# Patient Record
Sex: Female | Born: 1980 | Race: White | Hispanic: No | Marital: Married | Smoking: Never smoker
Health system: Southern US, Community
[De-identification: ages and names within clinical notes are randomized; demographics above are authoritative.]

## PROBLEM LIST (undated history)

## (undated) DIAGNOSIS — R002 Palpitations: Secondary | ICD-10-CM

## (undated) DIAGNOSIS — Z975 Presence of (intrauterine) contraceptive device: Secondary | ICD-10-CM

## (undated) DIAGNOSIS — F419 Anxiety disorder, unspecified: Secondary | ICD-10-CM

## (undated) DIAGNOSIS — K219 Gastro-esophageal reflux disease without esophagitis: Secondary | ICD-10-CM

## (undated) DIAGNOSIS — F410 Panic disorder [episodic paroxysmal anxiety] without agoraphobia: Secondary | ICD-10-CM

## (undated) DIAGNOSIS — J353 Hypertrophy of tonsils with hypertrophy of adenoids: Secondary | ICD-10-CM

## (undated) DIAGNOSIS — B977 Papillomavirus as the cause of diseases classified elsewhere: Secondary | ICD-10-CM

## (undated) DIAGNOSIS — T4145XA Adverse effect of unspecified anesthetic, initial encounter: Secondary | ICD-10-CM

## (undated) DIAGNOSIS — R1011 Right upper quadrant pain: Principal | ICD-10-CM

## (undated) DIAGNOSIS — T8859XA Other complications of anesthesia, initial encounter: Secondary | ICD-10-CM

## (undated) DIAGNOSIS — R87619 Unspecified abnormal cytological findings in specimens from cervix uteri: Secondary | ICD-10-CM

## (undated) DIAGNOSIS — IMO0002 Reserved for concepts with insufficient information to code with codable children: Secondary | ICD-10-CM

## (undated) DIAGNOSIS — J020 Streptococcal pharyngitis: Secondary | ICD-10-CM

## (undated) HISTORY — DX: Reserved for concepts with insufficient information to code with codable children: IMO0002

## (undated) HISTORY — DX: Presence of (intrauterine) contraceptive device: Z97.5

## (undated) HISTORY — DX: Panic disorder (episodic paroxysmal anxiety): F41.0

## (undated) HISTORY — PX: TYMPANOSTOMY TUBE PLACEMENT: SHX32

## (undated) HISTORY — DX: Anxiety disorder, unspecified: F41.9

## (undated) HISTORY — DX: Right upper quadrant pain: R10.11

## (undated) HISTORY — DX: Unspecified abnormal cytological findings in specimens from cervix uteri: R87.619

## (undated) HISTORY — DX: Papillomavirus as the cause of diseases classified elsewhere: B97.7

---
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
## 2002-02-16 ENCOUNTER — Inpatient Hospital Stay (HOSPITAL_COMMUNITY): Admission: AD | Admit: 2002-02-16 | Discharge: 2002-02-18 | Payer: Self-pay | Admitting: Obstetrics and Gynecology

## 2002-03-26 ENCOUNTER — Emergency Department (HOSPITAL_COMMUNITY): Admission: EM | Admit: 2002-03-26 | Discharge: 2002-03-26 | Payer: Self-pay | Admitting: Emergency Medicine

## 2002-03-30 ENCOUNTER — Emergency Department (HOSPITAL_COMMUNITY): Admission: EM | Admit: 2002-03-30 | Discharge: 2002-03-30 | Payer: Self-pay | Admitting: Emergency Medicine

## 2002-08-30 ENCOUNTER — Emergency Department (HOSPITAL_COMMUNITY): Admission: EM | Admit: 2002-08-30 | Discharge: 2002-08-30 | Payer: Self-pay | Admitting: *Deleted

## 2003-11-07 ENCOUNTER — Emergency Department (HOSPITAL_COMMUNITY): Admission: EM | Admit: 2003-11-07 | Discharge: 2003-11-07 | Payer: Self-pay | Admitting: Emergency Medicine

## 2005-02-27 ENCOUNTER — Ambulatory Visit (HOSPITAL_COMMUNITY): Admission: RE | Admit: 2005-02-27 | Discharge: 2005-02-27 | Payer: Self-pay | Admitting: Obstetrics and Gynecology

## 2005-04-09 ENCOUNTER — Ambulatory Visit (HOSPITAL_COMMUNITY): Admission: AD | Admit: 2005-04-09 | Discharge: 2005-04-09 | Payer: Self-pay | Admitting: Obstetrics and Gynecology

## 2005-05-14 ENCOUNTER — Inpatient Hospital Stay (HOSPITAL_COMMUNITY): Admission: AD | Admit: 2005-05-14 | Discharge: 2005-05-16 | Payer: Self-pay | Admitting: Obstetrics & Gynecology

## 2005-06-30 ENCOUNTER — Ambulatory Visit: Payer: Self-pay | Admitting: Cardiology

## 2005-06-30 ENCOUNTER — Ambulatory Visit (HOSPITAL_COMMUNITY): Admission: RE | Admit: 2005-06-30 | Discharge: 2005-06-30 | Payer: Self-pay | Admitting: Obstetrics and Gynecology

## 2005-08-22 ENCOUNTER — Emergency Department (HOSPITAL_COMMUNITY): Admission: EM | Admit: 2005-08-22 | Discharge: 2005-08-23 | Payer: Self-pay | Admitting: Emergency Medicine

## 2005-08-26 ENCOUNTER — Emergency Department (HOSPITAL_COMMUNITY): Admission: EM | Admit: 2005-08-26 | Discharge: 2005-08-27 | Payer: Self-pay | Admitting: Emergency Medicine

## 2005-09-03 ENCOUNTER — Ambulatory Visit (HOSPITAL_COMMUNITY): Admission: RE | Admit: 2005-09-03 | Discharge: 2005-09-03 | Payer: Self-pay | Admitting: Family Medicine

## 2006-10-12 ENCOUNTER — Emergency Department (HOSPITAL_COMMUNITY): Admission: EM | Admit: 2006-10-12 | Discharge: 2006-10-12 | Payer: Self-pay | Admitting: Emergency Medicine

## 2007-12-02 ENCOUNTER — Other Ambulatory Visit: Admission: RE | Admit: 2007-12-02 | Discharge: 2007-12-02 | Payer: Self-pay | Admitting: Obstetrics and Gynecology

## 2008-03-15 ENCOUNTER — Emergency Department (HOSPITAL_COMMUNITY): Admission: EM | Admit: 2008-03-15 | Discharge: 2008-03-15 | Payer: Self-pay | Admitting: Emergency Medicine

## 2008-03-28 ENCOUNTER — Ambulatory Visit (HOSPITAL_COMMUNITY): Admission: RE | Admit: 2008-03-28 | Discharge: 2008-03-28 | Payer: Self-pay | Admitting: Family Medicine

## 2008-07-09 ENCOUNTER — Other Ambulatory Visit: Admission: RE | Admit: 2008-07-09 | Discharge: 2008-07-09 | Payer: Self-pay | Admitting: Obstetrics and Gynecology

## 2009-07-22 ENCOUNTER — Other Ambulatory Visit: Admission: RE | Admit: 2009-07-22 | Discharge: 2009-07-22 | Payer: Self-pay | Admitting: Obstetrics and Gynecology

## 2009-10-08 ENCOUNTER — Emergency Department (HOSPITAL_COMMUNITY): Admission: EM | Admit: 2009-10-08 | Discharge: 2009-10-08 | Payer: Self-pay | Admitting: Emergency Medicine

## 2010-04-08 ENCOUNTER — Other Ambulatory Visit: Admission: RE | Admit: 2010-04-08 | Discharge: 2010-04-08 | Payer: Self-pay | Admitting: Obstetrics and Gynecology

## 2010-05-12 ENCOUNTER — Ambulatory Visit: Payer: Self-pay | Admitting: Family

## 2010-05-12 ENCOUNTER — Inpatient Hospital Stay (HOSPITAL_COMMUNITY): Admission: AD | Admit: 2010-05-12 | Discharge: 2010-05-13 | Payer: Self-pay | Admitting: Family Medicine

## 2010-11-06 ENCOUNTER — Inpatient Hospital Stay (HOSPITAL_COMMUNITY)
Admission: AD | Admit: 2010-11-06 | Discharge: 2010-11-08 | DRG: 775 | Disposition: A | Discharge status: Home / Self Care | Payer: 59 | Source: Ambulatory Visit | Attending: Obstetrics and Gynecology | Admitting: Obstetrics and Gynecology

## 2010-11-06 DIAGNOSIS — O99892 Other specified diseases and conditions complicating childbirth: Secondary | ICD-10-CM

## 2010-11-06 DIAGNOSIS — O3660X0 Maternal care for excessive fetal growth, unspecified trimester, not applicable or unspecified: Secondary | ICD-10-CM

## 2010-11-06 DIAGNOSIS — Z22330 Carrier of Group B streptococcus: Secondary | ICD-10-CM

## 2010-11-06 DIAGNOSIS — O9989 Other specified diseases and conditions complicating pregnancy, childbirth and the puerperium: Secondary | ICD-10-CM

## 2010-11-06 LAB — CBC
HCT: 34.9 % — ABNORMAL LOW (ref 36.0–46.0)
Hemoglobin: 10.9 g/dL — ABNORMAL LOW (ref 12.0–15.0)
MCH: 25.7 pg — ABNORMAL LOW (ref 26.0–34.0)
MCHC: 31.2 g/dL (ref 30.0–36.0)
MCV: 82.3 fL (ref 78.0–100.0)
RBC: 4.24 MIL/uL (ref 3.87–5.11)
RDW: 14.5 % (ref 11.5–15.5)
WBC: 6.8 10*3/uL (ref 4.0–10.5)

## 2010-11-13 NOTE — H&P (Signed)
  NAMEARNECIA, Heather [name]           ACCOUNT NO.:  [id number]  MEDICAL RECORD NO.:  [id number]         PATIENT TYPE:  WINP  LOCATION:  166                           FACILITY:  [facility]  PHYSICIAN:  Tilda Burrow, M.D. DATE OF BIRTH:  12/18/1980  DATE OF ADMISSION:  11/05/2010 DATE OF DISCHARGE:                             HISTORY & PHYSICAL   ADMISSION DIAGNOSIS:  Pregnancy 13 and 9 weeks 2 days, elective induction of labor.  HISTORY OF PRESENT ILLNESS:  This is a 30 year old female gravida 3, para 2-0-0-2 with ultrasound assigned EDC of November 10, 2009 based on a 6-week ultrasound and menstrual assigned [facility] of November 06, 2010 placing her at 39 weeks 2 by earliest ultrasound and 39 weeks 5 days by LMP.  She is scheduled for elective induction of labor.  Prenatal course has been notable for this baby being the largest baby she has had.  She is a gravida 3, para 2.  Her prior babies weighed 7.5 and 7.14.  This infant was estimated by ultrasound on October 23, 2010 to be 3818 grams almost a pound larger.  Cervix is quite favorable at 4 cm, 50%, -1, vertex presentation.  Risks of induction including fetal distress, difficulty with delivery or other usual complications associated with labor have been reviewed.  The patient requests induction of labor due to concerns over fetal/infant size and pressure and discomfort.  PRENATAL LABORATORY DATA:  Blood type is A+.  Antibody screen negative. Rubella immune present.  Hemoglobin 13 and hematocrit 39.  Hepatitis, HIV, RPR, GC, and Chlamydia all negative.  MSAFP normal.  Hemoglobin 11, hematocrit 34 at 28 weeks.  Glucose tolerance test normal on 2-hour testing at 28 weeks.  It is a female, she plans to breastfeed and bottle supplement.  Future contraception is undetermined.  PHYSICAL EXAMINATION:  GENERAL:  Healthy Caucasian female. VITAL SIGNS:  Weight 188.4 which is a 24-pound weight gain, fundal height is 42 cm, estimated fetal weight is  8.5-9 pounds. GU:  Cervix is 4 cm, 50%, -1, posteriorly oriented cervix, vertex presentation well applied to cervix.  PLAN:  Admit this evening for induction of labor.  Good prognosis for vaginal delivery.     Tilda Burrow, M.D.     JVF/MEDQ  D:  11/05/2010  T:  11/05/2010  Job:  [id number]  cc:   [facility] Obstetrics and Gynecology  Electronically Signed by Christin Bach M.D. on 11/13/2010 06:36:09 PM

## 2010-12-05 LAB — COMPREHENSIVE METABOLIC PANEL
ALT: 43 U/L — ABNORMAL HIGH (ref 0–35)
AST: 28 U/L (ref 0–37)
Albumin: 3.2 g/dL — ABNORMAL LOW (ref 3.5–5.2)
Alkaline Phosphatase: 49 U/L (ref 39–117)
BUN: 6 mg/dL (ref 6–23)
CO2: 26 mEq/L (ref 19–32)
Calcium: 9 mg/dL (ref 8.4–10.5)
Chloride: 108 mEq/L (ref 96–112)
Creatinine, Ser: 0.57 mg/dL (ref 0.4–1.2)
GFR calc Af Amer: >60 mL/min (ref >60)
GFR calc non Af Amer: >60 mL/min (ref >60)
Glucose, Bld: 87 mg/dL (ref 70–99)
Potassium: 3.4 mEq/L — ABNORMAL LOW (ref 3.5–5.1)
Sodium: 139 mEq/L (ref 135–145)
Total Bilirubin: 0.3 mg/dL (ref 0.3–1.2)
Total Protein: 5.8 g/dL — ABNORMAL LOW (ref 6.0–8.3)

## 2010-12-05 LAB — CBC
HCT: 32.2 % — ABNORMAL LOW (ref 36.0–46.0)
Hemoglobin: 11.3 g/dL — ABNORMAL LOW (ref 12.0–15.0)
MCH: 31.7 pg (ref 26.0–34.0)
MCHC: 35.1 g/dL (ref 30.0–36.0)
MCV: 90.4 fL (ref 78.0–100.0)
Platelets: 149 10*3/uL — ABNORMAL LOW (ref 150–400)
RBC: 3.56 MIL/uL — ABNORMAL LOW (ref 3.87–5.11)
RDW: 12.8 % (ref 11.5–15.5)
WBC: 7.4 10*3/uL (ref 4.0–10.5)

## 2011-02-06 NOTE — Op Note (Signed)
NAME:  Heather [name], Heather [name]           ACCOUNT NO.:  [id number]   MEDICAL RECORD NO.:  [id number]          PATIENT TYPE:  INP   LOCATION:  A413                          FACILITY:  [facility]   PHYSICIAN:  Lazaro Arms, M.D.   DATE OF BIRTH:  [birth year]   DATE OF PROCEDURE:  DATE OF DISCHARGE:  05/16/2005                                 OPERATIVE REPORT   PROCEDURE:  Epidural placement.   INDICATION:  [name] is a 30 year old female who was induced at 39 weeks  because of general discomfort and advanced cervical dilatation.  She was  placed on Pitocin induction on the morning of 05/14/2005 and wanted an  epidural to be placed.   DESCRIPTION OF PROCEDURE:  She was placed in the sitting position and  Betadine prep was used.  The L3-L4 space was identified and infiltrate was  placed.  The area was anesthetized with 1% lidocaine.  A 17-gauge Tuohy  needle was used and loss of resistance technique employed.  The epidural  space was found without difficulty.  Ten mL 0.125% bupivacaine plain was  given into the epidural space without adverse effects.  Epidural catheter  was then fed into the epidural space and the needle was removed.  An  additional 10 mL of 0.125% bupivacaine was given to bolus up the epidural.  It was then taped down 5 cm into the epidural space of the skin.  A  continuous infusion was then started with 0.125% bupivacaine with 2 mcg/mL  fentanyl at 12 mL/hr.  The patient tolerated the procedure well, was getting  relief, and the blood pressure and fetal heart rate tracing were reassuring.      Lazaro Arms, M.D.  Electronically Signed     LHE/MEDQ  D:  06/24/2005  T:  06/24/2005  Job:  [id number]

## 2011-02-06 NOTE — Consult Note (Signed)
NAME:  Heather, [name] NO.:  [id number]   MEDICAL RECORD NO.:  [id number]          PATIENT TYPE:  OIB   LOCATION:  A415                          FACILITY:  [facility]   PHYSICIAN:  Tilda Burrow, M.D. DATE OF BIRTH:  09-Oct-1980   DATE OF CONSULTATION:  DATE OF DISCHARGE:                                   CONSULTATION   HISTORY OF PRESENT ILLNESS:  Heather [name] came in on February 27, 2005 at nine  minutes after midnight with complaints of some dizziness earlier in the  evening.  I think it just scared her.  She was not dizzy when she got to the  hospital.  Her blood sugar was fine, blood pressure was fine, the baby  looked fine, everything was fine.  She was discharged home one hour later.       FC/MEDQ  D:  02/27/2005  T:  02/27/2005  Job:  [id number]   cc:   [facility]

## 2011-02-06 NOTE — Op Note (Signed)
NAME:  Heather [name], Heather [name] NO.:  [id number]   MEDICAL RECORD NO.:  [id number]          PATIENT TYPE:  INP   LOCATION:  LDR2                          FACILITY:  [facility]   PHYSICIAN:  Tilda Burrow, M.D. DATE OF BIRTH:  [birth year]   DATE OF PROCEDURE:  05/14/2005  DATE OF DISCHARGE:                                 OPERATIVE REPORT   DELIVERY NOTE:  [name] was fully dilated with complaints of some pressure  at approximately 1330. She pushed for about 40 minutes and had a spontaneous  vaginal delivery of a viable female infant at 30. The mouth and nose were  suctioned on the perineum, the body delivered without difficulty. Apgars  were 9 and 9. Weight 7 pounds 5 ounces. The placenta separated partially a  few minutes later, and she had some bleeding. Twenty units of pitocin  diluted in 1,000 cc of lactated ringers was started and using wide open IV.  I ended up having to go in and do a partial manual removal of the placenta.  It came out intact at about 1420. There was a three-vessel cord. After the  delivery of the placenta, the lochia was fine. Fundus was firm at about 2  below the umbilicus. The epidural catheter was then removed with the blue  tip visualized as being intact. Estimated blood loss about 400 to 500 cc.  The vagina was inspected and was intact.      [name], C.N.M.      Tilda Burrow, M.D.  Electronically Signed    FC/MEDQ  D:  05/14/2005  T:  05/14/2005  Job:  [id number]

## 2011-02-06 NOTE — Procedures (Signed)
NAME:  Heather [name], Heather [name]           ACCOUNT NO.:  [id number]   MEDICAL RECORD NO.:  [id number]          PATIENT TYPE:  OUT   LOCATION:  RAD                           FACILITY:  [facility]   PHYSICIAN:  [place] Bing, M.D. [facility] OF BIRTH:  [birth year]   DATE OF PROCEDURE:  07/01/2005  DATE OF DISCHARGE:                                  ECHOCARDIOGRAM   REFERRING:  Dr. Phillips Odor and Dr. Emelda Fear   CLINICAL DATA:  A 30 year old woman who is five weeks postpartum and is now  experiencing palpitations and chest pain.   Aorta 2.1, left atrium 2.7, septum 0.9, posterior wall 0.9, LV diastole 4.0,  LV systole 2.8.   1.  Technically adequate echocardiographic study.  2.  Normal left atrium, right atrium and right ventricle.  3.  Normal aortic, tricuspid, pulmonic and mitral valves.  4.  Normal Doppler study with physiologic tricuspid regurgitation; normal      estimated RV systolic pressure.  5.  Normal proximal pulmonary artery.  6.  Normal IVC.  7.  Normal internal dimension, wall thickness, regional and global function      of the left ventricle.      [place]vel Bing, M.D. [facility]  Electronically Signed     RR/MEDQ  D:  07/01/2005  T:  07/01/2005  Job:  [id number]

## 2011-02-06 NOTE — H&P (Signed)
NAME:  Heather [name], HALBERG NO.:  [id number]   MEDICAL RECORD NO.:  [id number]          PATIENT TYPE:  INP   LOCATION:  LDR2                          FACILITY:  [facility]   PHYSICIAN:  Lazaro Arms, M.D.   DATE OF BIRTH:  09/10/1981   DATE OF ADMISSION:  DATE OF DISCHARGE:  LH                                HISTORY & PHYSICAL   CHIEF COMPLAINT:  Labor induction secondary to advanced cervical dilatation  and maternal discomfort.   HISTORY OF PRESENT ILLNESS:  Heather [name] is a 30 year old gravida 2, para 1  with an EDC of May 22, 2005 based on a first and second trimester  ultrasound, placing her at [redacted] weeks gestation. She started prenatal care in  her first trimester and has had regular visits. Her prenatal course has  basically been uneventful. Blood pressures have been 100 to 120s over 60s to  80s. Total weight gain has been 24 pounds with appropriate fundal height  growth.   PRENATAL LABORATORY DATA:  Blood type A positive, rubella immune. HBSAG,  HIV, RPR, HSV, gonorrhea, chlamydia are all negative. GBS is positive. One-  hour GTT was normal at 102. MS AFP was also normal. A 28-week H and H was 12  and 36.   PAST MEDICAL HISTORY:  History of hypothyroidism. Her TSH at last check was  within normal limits.   PAST SURGICAL HISTORY:  Noncontributory.   ALLERGIES:  No known drug allergies.   HABITS:  Denies cigarette smoking, alcohol, or drug use.   SOCIAL HISTORY:  Married. Works in the [employer].   FAMILY HISTORY:  Noncontributory.   OBSTETRICAL HISTORY:  Had a five-hour induced labor of a 7-pound 15-ounce  female back in 2003. Pretty much the same scenario with 4 cm dilated and then  received AROM and Pitocin.   PHYSICAL EXAMINATION:  HEENT:  Within normal limits.  HEART:  Regular rate and rhythm.  LUNGS:  Clear.  ABDOMEN:  Soft and nontender. Fundal height is 37 cm.  PELVIC:  Cervix was 3 to 4 cm, 75% effaced, 0 station at vertex  presentation.  EXTREMITIES:  Legs trace edema.   IMPRESSION:  Intrauterine pregnancy at 39 weeks, elective induction of  labor. 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   PLAN:  Will admit to the labor and delivery unit early Thursday morning.  Begin GBS prophylaxis, Pitocin, and AROM. The patient plans an epidural.      [name], C.N.M.      Lazaro Arms, M.D.  Electronically Signed    FC/MEDQ  D:  05/12/2005  T:  05/12/2005  Job:  [id number]

## 2013-01-24 ENCOUNTER — Other Ambulatory Visit: Payer: Self-pay | Admitting: Adult Health

## 2013-01-24 ENCOUNTER — Encounter: Payer: Self-pay | Admitting: *Deleted

## 2013-01-25 ENCOUNTER — Encounter: Payer: Self-pay | Admitting: Adult Health

## 2013-01-25 ENCOUNTER — Ambulatory Visit (INDEPENDENT_AMBULATORY_CARE_PROVIDER_SITE_OTHER): Payer: 59 | Admitting: Adult Health

## 2013-01-25 ENCOUNTER — Other Ambulatory Visit (HOSPITAL_COMMUNITY)
Admission: RE | Admit: 2013-01-25 | Discharge: 2013-01-25 | Disposition: A | Discharge status: Home / Self Care | Payer: 59 | Source: Ambulatory Visit | Attending: Adult Health | Admitting: Adult Health

## 2013-01-25 VITALS — BP 108/60 | HR 88 | Ht 64.5 in | Wt 174.0 lb

## 2013-01-25 DIAGNOSIS — Z87898 Personal history of other specified conditions: Secondary | ICD-10-CM | POA: Insufficient documentation

## 2013-01-25 DIAGNOSIS — Z01419 Encounter for gynecological examination (general) (routine) without abnormal findings: Secondary | ICD-10-CM | POA: Insufficient documentation

## 2013-01-25 DIAGNOSIS — Z113 Encounter for screening for infections with a predominantly sexual mode of transmission: Secondary | ICD-10-CM | POA: Insufficient documentation

## 2013-01-25 DIAGNOSIS — Z975 Presence of (intrauterine) contraceptive device: Secondary | ICD-10-CM | POA: Insufficient documentation

## 2013-01-25 DIAGNOSIS — F419 Anxiety disorder, unspecified: Secondary | ICD-10-CM | POA: Insufficient documentation

## 2013-01-25 MED ORDER — IBUPROFEN 100 MG PO TABS: 200.0000 mg | ORAL_TABLET | Freq: Four times a day (QID) | ORAL | Status: DC | PRN

## 2013-01-25 MED ORDER — CITALOPRAM HYDROBROMIDE 20 MG PO TABS: 20.0000 mg | ORAL_TABLET | Freq: Every day | ORAL | Status: DC

## 2013-01-25 NOTE — Patient Instructions (Addendum)
Follow up in 1 year physical Sign up for my chart

## 2013-01-25 NOTE — Progress Notes (Signed)
Patient ID: Heather [name], female   DOB: 1981-08-23, 32 y.o.   MRN: [id number] History of Present Illness: Heather [name] is a 32 year old white female in for pap and physical. She works 12 hours at [employer].  Current Medications, Allergies, Past Medical History, Past Surgical History, Family History and Social History were reviewed in [employer] record.    Review of Systems: Patient denies any headaches, blurred vision, shortness of breath, chest pain, abdominal pain, problems with bowel movements, urination, or intercourse. No joint swelling, she is good on celexa for her anxiety   Physical Exam:Blood pressure 108/60, pulse 88, height 5' 4.5" (1.638 m), weight 174 lb (78.926 kg). General:  Well developed, well nourished, no acute distress Skin:  Warm and dry Neck:  Midline trachea, normal thyroid Lungs; Clear to auscultation bilaterally Breast:  No dominant palpable mass, retraction, or nipple discharge Cardiovascular: Regular rate and rhythm Abdomen:  Soft, non tender, no hepatosplenomegaly, she has a navel ring Pelvic:  External genitalia is normal in appearance.  The vagina is normal in appearance. The cervix is bulbous.IUD strings not visible,pap performed with GC/CHL, and IUD placement confirmed by [place].  Uterus is felt to be normal size, shape, and contour.  No adnexal masses or tenderness noted. Extremities:  No swelling or varicosities noted Psych:  No mood changes, alert and cooperative   Impression: Yearly exam Anxiety History of abnormal pap IUD in place  Plan: Physical in 1 year

## 2013-01-31 ENCOUNTER — Telehealth: Payer: Self-pay | Admitting: Adult Health

## 2013-01-31 NOTE — Telephone Encounter (Signed)
Left message that pap was good

## 2014-01-22 ENCOUNTER — Ambulatory Visit (INDEPENDENT_AMBULATORY_CARE_PROVIDER_SITE_OTHER): Payer: 59 | Admitting: Orthopedic Surgery

## 2014-01-22 ENCOUNTER — Ambulatory Visit (INDEPENDENT_AMBULATORY_CARE_PROVIDER_SITE_OTHER): Payer: 59

## 2014-01-22 ENCOUNTER — Encounter: Payer: Self-pay | Admitting: Orthopedic Surgery

## 2014-01-22 VITALS — BP 127/86 | Ht 66.0 in | Wt 172.0 lb

## 2014-01-22 DIAGNOSIS — M25559 Pain in unspecified hip: Secondary | ICD-10-CM

## 2014-01-22 DIAGNOSIS — M25552 Pain in left hip: Secondary | ICD-10-CM

## 2014-01-22 DIAGNOSIS — S76019A Strain of muscle, fascia and tendon of unspecified hip, initial encounter: Secondary | ICD-10-CM | POA: Insufficient documentation

## 2014-01-22 DIAGNOSIS — IMO0002 Reserved for concepts with insufficient information to code with codable children: Secondary | ICD-10-CM

## 2014-01-22 MED ORDER — NABUMETONE 500 MG PO TABS: 500.0000 mg | ORAL_TABLET | Freq: Every day | ORAL | Status: DC

## 2014-01-22 NOTE — Patient Instructions (Signed)
Take medication as prescribed.

## 2014-01-22 NOTE — Progress Notes (Signed)
Patient ID: Heather [name], female   DOB: Oct 16, 1980, 33 y.o.   MRN: [id number]  Chief Complaint  Patient presents with  . Hip Pain    Left hip pain, no injury    This is a 33 year old female presents with a history of atraumatic onset of left hip pain x2 months which may or may not be related to her exercise program. She's not really had any treatment at this time pain occurs when walking for exercise or walking at work. The pain is over the left iliac crest and is associated with catching a dull burning sensation and some aching. Pain is 4/10 it hurts constantly she did take some Tylenol without relief  Her past family social history and review of systems are recorded in the medical record for the following update on her review of systems. 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 Otherwise normal. Current medications Celexa 10 mg daily  Her mother and father are both alive. She has no allergies to medications but did have a reaction to lidocaine and does not like pain medications such as narcotics  BP 127/86  Ht 5\' 6"  (1.676 m)  Wt 172 lb (78.019 kg)  BMI 27.77 kg/m2 Vital signs:   General the patient is well-developed and well-nourished grooming and hygiene are normal Oriented x3 Mood and affect normal Ambulation normal  Inspection of the right hip normal range of motion and stability test motor exam normal skin clean dry intact normal distal neurovascular function.  Left hip tenderness over the iliac crest and abductor musculature without tenderness over the greater trochanter. She has full range of motion of the hip with pain with internal rotation but not in the groin and over the iliac crest she has pain to manual muscle testing against hip abduction also in the same area. Motor exam is normal skin clean dry and intact cardiovascular and normal sensory exam normal   Left hip AP lateral x-ray are normal  Encounter Diagnoses   Name Primary?  . Left hip pain Yes  . Strain of hip     Recommend Relafen 500 mg twice a day followup in one month

## 2014-02-02 ENCOUNTER — Other Ambulatory Visit: Payer: 59 | Admitting: Adult Health

## 2014-02-22 ENCOUNTER — Other Ambulatory Visit: Payer: 59 | Admitting: Adult Health

## 2014-02-27 ENCOUNTER — Ambulatory Visit: Payer: 59 | Admitting: Orthopedic Surgery

## 2014-03-05 ENCOUNTER — Other Ambulatory Visit: Payer: 59 | Admitting: Adult Health

## 2014-03-15 ENCOUNTER — Other Ambulatory Visit: Payer: Self-pay | Admitting: Adult Health

## 2014-03-15 ENCOUNTER — Telehealth: Payer: Self-pay | Admitting: Adult Health

## 2014-03-15 NOTE — Telephone Encounter (Signed)
Spoke with pt. Pt states Celexa has already been ordered. Pt was wondering if any refills was on that Rx. I advised 11 refills were ordered. Pt voiced understanding. JSY

## 2014-03-22 ENCOUNTER — Ambulatory Visit: Payer: 59 | Admitting: Orthopedic Surgery

## 2014-03-22 ENCOUNTER — Encounter: Payer: Self-pay | Admitting: Orthopedic Surgery

## 2014-04-05 ENCOUNTER — Other Ambulatory Visit: Payer: 59 | Admitting: Adult Health

## 2014-05-03 ENCOUNTER — Ambulatory Visit (INDEPENDENT_AMBULATORY_CARE_PROVIDER_SITE_OTHER): Payer: 59 | Admitting: Adult Health

## 2014-05-03 ENCOUNTER — Other Ambulatory Visit (HOSPITAL_COMMUNITY)
Admission: RE | Admit: 2014-05-03 | Discharge: 2014-05-03 | Disposition: A | Discharge status: Home / Self Care | Payer: 59 | Source: Ambulatory Visit | Attending: Adult Health | Admitting: Adult Health

## 2014-05-03 ENCOUNTER — Encounter: Payer: Self-pay | Admitting: Adult Health

## 2014-05-03 VITALS — BP 100/70 | HR 76 | Ht 64.5 in | Wt 173.5 lb

## 2014-05-03 DIAGNOSIS — Z124 Encounter for screening for malignant neoplasm of cervix: Secondary | ICD-10-CM | POA: Diagnosis not present

## 2014-05-03 DIAGNOSIS — F419 Anxiety disorder, unspecified: Secondary | ICD-10-CM

## 2014-05-03 DIAGNOSIS — Z8742 Personal history of other diseases of the female genital tract: Secondary | ICD-10-CM

## 2014-05-03 DIAGNOSIS — Z1151 Encounter for screening for human papillomavirus (HPV): Secondary | ICD-10-CM | POA: Diagnosis present

## 2014-05-03 DIAGNOSIS — R87810 Cervical high risk human papillomavirus (HPV) DNA test positive: Secondary | ICD-10-CM | POA: Diagnosis not present

## 2014-05-03 DIAGNOSIS — Z01419 Encounter for gynecological examination (general) (routine) without abnormal findings: Secondary | ICD-10-CM

## 2014-05-03 DIAGNOSIS — Z975 Presence of (intrauterine) contraceptive device: Secondary | ICD-10-CM

## 2014-05-03 DIAGNOSIS — Z113 Encounter for screening for infections with a predominantly sexual mode of transmission: Secondary | ICD-10-CM | POA: Insufficient documentation

## 2014-05-03 NOTE — Patient Instructions (Signed)
Physical in 1 year Check breasts regular

## 2014-05-03 NOTE — Progress Notes (Signed)
Patient ID: Heather [name], female   DOB: 1980-12-15, 33 y.o.   MRN: [id number] History of Present Illness: Heather [name] is a 33 year old white female, single in for a pap and physical, complains of pain in breast.Has IUD and is happy not having periods, ha more discharge since IUD.   Current Medications, Allergies, Past Medical History, Past Surgical History, Family History and Social History were reviewed in [facility] electronic medical record.     Review of Systems: Patient denies any headaches, blurred vision, shortness of breath, chest pain, abdominal pain, problems with bowel movements, urination, or intercourse. No joint pain,moods good on celexa.See HPI for positives.    Physical Exam:BP 100/70  Pulse 76  Ht 5' 4.5" (1.638 m)  Wt 173 lb 8 oz (78.699 kg)  BMI 29.33 kg/m2 General:  Well developed, well nourished, no acute distress Skin:  Warm and dry Neck:  Midline trachea, normal thyroid Lungs; Clear to auscultation bilaterally Breast:  No dominant palpable mass, retraction, or nipple discharge, has bilateral regular irregularities  Cardiovascular: Regular rate and rhythm Abdomen:  Soft, non tender, no hepatosplenomegaly Pelvic:  External genitalia is normal in appearance.  The vagina is normal in appearance.  The cervix is bulbous, +IUD strings, pap with HPV,GC/CHL performed.  Uterus is felt to be normal size, shape, and contour.  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 Let her do breast exam on handheld model and discussed findings. Declines blood work.   Impression: Yearly gyn exam  IUD in place Anxiety History of abnormal pap    Plan: Physical in 1 year Check breasts, call with any changes

## 2014-05-04 LAB — CYTOLOGY - PAP

## 2014-05-08 ENCOUNTER — Telehealth: Payer: Self-pay | Admitting: Adult Health

## 2014-05-08 NOTE — Telephone Encounter (Signed)
Pt aware of pap showing abnormal cells and +HPV will make appt for colpo

## 2014-05-08 NOTE — Telephone Encounter (Signed)
Has colpo appt Monday had question about bleeding after pap but it stopped.keep appt

## 2014-05-14 ENCOUNTER — Encounter: Payer: Self-pay | Admitting: Obstetrics & Gynecology

## 2014-05-14 ENCOUNTER — Ambulatory Visit (INDEPENDENT_AMBULATORY_CARE_PROVIDER_SITE_OTHER): Payer: 59 | Admitting: Obstetrics & Gynecology

## 2014-05-14 ENCOUNTER — Other Ambulatory Visit: Payer: Self-pay | Admitting: Obstetrics & Gynecology

## 2014-05-14 VITALS — BP 120/72 | Ht 65.0 in | Wt 174.0 lb

## 2014-05-14 DIAGNOSIS — D069 Carcinoma in situ of cervix, unspecified: Secondary | ICD-10-CM | POA: Insufficient documentation

## 2014-05-14 DIAGNOSIS — Z3202 Encounter for pregnancy test, result negative: Secondary | ICD-10-CM

## 2014-05-14 DIAGNOSIS — Z3200 Encounter for pregnancy test, result unknown: Secondary | ICD-10-CM

## 2014-05-14 LAB — POCT URINE PREGNANCY: Preg Test, Ur: NEGATIVE

## 2014-05-14 NOTE — Progress Notes (Signed)
Patient ID: Heather [name], female   DOB: 1981-09-20, 33 y.o.   MRN: [id number] Colposcopy Procedure Note:  Pap performed:  8/13 Result:  HSIL Smoker:  No. New sexual partner:  No.  : time frame:    History of abnormal Pap: yes 8-9 years ago  Due to the indications listed above, a thorough colposcopic evaluation of the cervix and upper vagina was performed in the usual fashion using 3% acetic acid.  The findings of the visual inspection are noted below:  adequate Acetowhite changes       positive Punctation                      positive Mosaicism                      negative Abnormal Vessels          negative  Biopsy performed:          Yes.    Complications: no   Impression: LSIL  Recommendation: Follow up 1 week      Past Medical History  Diagnosis Date  . Anxiety   . Abnormal finding on Pap smear, ASCUS     positive for HPV on Pap  . HPV (human papilloma virus) infection   . Panic attack   . Abnormal Pap smear   . Anemia   . IUD (intrauterine device) in place 01/25/2013  . Ventricular dysrhythmia   . Vaginal Pap smear, abnormal     Past Surgical History  Procedure Laterality Date  . No past surgeries      OB History   Grav Para Term Preterm Abortions TAB SAB Ect Mult Living   Allergies  Allergen Reactions  . 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 Penicillins     Pt has taken amoxicillin with reaction    History   Social History  . Marital Status: Single    Spouse Name: N/A    Number of Children: N/A  . Years of Education: N/A   Social History Main Topics  . Smoking status: Never Smoker   . Smokeless tobacco: Never Used  . Alcohol Use: No  . Drug Use: No  . Sexual Activity: Yes    Birth Control/ Protection: IUD   Other Topics Concern  . None   Social History Narrative  . None    Family History  Problem Relation Age of Onset  . Hypertension Mother   . Other Mother      benign cysts in breasts  . Hypertension Father   . Other Father     has a pacemaker  . Cancer Paternal Grandfather     colon cancer  . Stroke Paternal Grandfather   . Asthma Son   . Macular degeneration Paternal Grandmother   . Other Maternal Grandmother     had a pacemaker

## 2014-05-17 ENCOUNTER — Ambulatory Visit (INDEPENDENT_AMBULATORY_CARE_PROVIDER_SITE_OTHER): Payer: 59 | Admitting: Otolaryngology

## 2014-05-17 DIAGNOSIS — J3501 Chronic tonsillitis: Secondary | ICD-10-CM

## 2014-05-21 ENCOUNTER — Ambulatory Visit (INDEPENDENT_AMBULATORY_CARE_PROVIDER_SITE_OTHER): Payer: 59 | Admitting: Obstetrics & Gynecology

## 2014-05-21 ENCOUNTER — Encounter: Payer: Self-pay | Admitting: Obstetrics & Gynecology

## 2014-05-21 VITALS — BP 110/80 | Wt 173.0 lb

## 2014-05-21 DIAGNOSIS — D069 Carcinoma in situ of cervix, unspecified: Secondary | ICD-10-CM

## 2014-05-21 NOTE — Progress Notes (Signed)
Patient ID: Heather [name], female   DOB: 1981/04/01, 33 y.o.   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 [phone] with No LMP recorded. Patient is not currently having periods (Reason: IUD). admitted for a laser conization of the cervix.  Colposcopic directed biopsies reveal HSIL  PMH:    Past Medical History  Diagnosis Date  . Anxiety   . Abnormal finding on Pap smear, ASCUS     positive for HPV on Pap  . HPV (human papilloma virus) infection   . Panic attack   . Abnormal Pap smear   . Anemia   . IUD (intrauterine device) in place 01/25/2013  . Ventricular dysrhythmia   . Vaginal Pap smear, abnormal     PSH:     Past Surgical History  Procedure Laterality Date  . No past surgeries      POb/GynH:      OB History   Grav Para Term Preterm Abortions TAB SAB Ect Mult Living   SH:   History  Substance Use Topics  . Smoking status: Never Smoker   . Smokeless tobacco: Never Used  . Alcohol Use: No    FH:    Family History  Problem Relation Age of Onset  . Hypertension Mother   . Other Mother     benign cysts in breasts  . Hypertension Father   . Other Father     has a pacemaker  . Cancer Paternal Grandfather     colon cancer  . Stroke Paternal Grandfather   . Asthma Son   . Macular degeneration Paternal Grandmother   . Other Maternal Grandmother     had a pacemaker     Allergies:  Allergies  Allergen Reactions  . 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 Penicillins     Pt has taken amoxicillin with reaction    Medications:      Current outpatient prescriptions:citalopram (CELEXA) 20 MG tablet, takes half a tab daily, Disp: , Rfl: ;  levonorgestrel (MIRENA) 20 MCG/24HR IUD, 1 each by Intrauterine route once., Disp: , Rfl: ;  ibuprofen (ADVIL,MOTRIN) 100 MG tablet, Take 2 tablets (200 mg  total) by mouth every 6 (six) hours as needed for fever., Disp: 30 tablet, Rfl: 3  Review of Systems:   Review of Systems  Constitutional: Negative for fever, chills, weight loss, malaise/fatigue and diaphoresis.  HENT: Negative for hearing loss, ear pain, nosebleeds, congestion, sore throat, neck pain, tinnitus and ear discharge.   Eyes: Negative for blurred vision, double vision, photophobia, pain, discharge and redness.  Respiratory: Negative for cough, hemoptysis, sputum production, shortness of breath, wheezing and stridor.   Cardiovascular: Negative for chest pain, palpitations, orthopnea, claudication, leg swelling and PND.  Gastrointestinal: Positive for abdominal pain. Negative for heartburn, nausea, vomiting, diarrhea, constipation, blood in stool and melena.  Genitourinary: Negative for dysuria, urgency, frequency, hematuria and flank pain.  Musculoskeletal: Negative for myalgias, back pain, joint pain and falls.  Skin: Negative for itching and rash.  Neurological: Negative for dizziness, tingling, tremors, sensory change, speech change, focal weakness, seizures, loss of consciousness, weakness and headaches.  Endo/Heme/Allergies: Negative for environmental allergies and polydipsia. Does not bruise/bleed easily.  Psychiatric/Behavioral: Negative for depression, suicidal ideas, hallucinations, memory loss and substance abuse. The  patient is not nervous/anxious and does not have insomnia.      PHYSICAL EXAM:  Blood pressure 110/80, weight 173 lb (78.472 kg).    Vitals reviewed. Constitutional: She is oriented to person, place, and time. She appears well-developed and well-nourished.  HENT:  Head: Normocephalic and atraumatic.  Right Ear: External ear normal.  Left Ear: External ear normal.  Nose: Nose normal.  Mouth/Throat: Oropharynx is clear and moist.  Eyes: Conjunctivae and EOM are normal. Pupils are equal, round, and reactive to light. Right eye exhibits no discharge.  Left eye exhibits no discharge. No scleral icterus.  Neck: Normal range of motion. Neck supple. No tracheal deviation present. No thyromegaly present.  Cardiovascular: Normal rate, regular rhythm, normal heart sounds and intact distal pulses.  Exam reveals no gallop and no friction rub.   No murmur heard. Respiratory: Effort normal and breath sounds normal. No respiratory distress. She has no wheezes. She has no rales. She exhibits no tenderness.  GI: Soft. Bowel sounds are normal. She exhibits no distension and no mass. There is tenderness. There is no rebound and no guarding.  Genitourinary:       Vulva is normal without lesions Vagina is pink moist without discharge Cervix normal in appearance Biopsy HSIL Uterus is normal Adnexa is negative with normal sized ovaries by sonogram  Musculoskeletal: Normal range of motion. She exhibits no edema and no tenderness.  Neurological: She is alert and oriented to person, place, and time. She has normal reflexes. She displays normal reflexes. No cranial nerve deficit. She exhibits normal muscle tone. Coordination normal.  Skin: Skin is warm and dry. No rash noted. No erythema. No pallor.  Psychiatric: She has a normal mood and affect. Her behavior is normal. Judgment and thought content normal.    Labs: Results for orders placed in visit on 05/14/14 (from the past 336 hour(s))  POCT URINE PREGNANCY   Collection Time    05/14/14  3:54 PM      Result Value Ref Range   Preg Test, Ur Negative      EKG: No orders found for this or any previous visit.  Imaging Studies: No results found.    Assessment: Patient Active Problem List   Diagnosis Date Noted  . 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 Strain of hip 01/22/2014  . Anxiety 01/25/2013  . History of abnormal Pap smear 01/25/2013  . IUD (intrauterine device) in place 01/25/2013    Plan: Laser conization of the cervix  [name] 05/21/2014 11:39 AM

## 2014-06-11 ENCOUNTER — Ambulatory Visit (INDEPENDENT_AMBULATORY_CARE_PROVIDER_SITE_OTHER): Payer: 59 | Admitting: Cardiovascular Disease

## 2014-06-11 ENCOUNTER — Encounter: Payer: Self-pay | Admitting: Cardiovascular Disease

## 2014-06-11 VITALS — BP 98/72 | HR 67 | Ht 65.0 in | Wt 173.0 lb

## 2014-06-11 DIAGNOSIS — R002 Palpitations: Secondary | ICD-10-CM

## 2014-06-11 DIAGNOSIS — I499 Cardiac arrhythmia, unspecified: Secondary | ICD-10-CM

## 2014-06-11 DIAGNOSIS — Z01818 Encounter for other preprocedural examination: Secondary | ICD-10-CM

## 2014-06-11 NOTE — Patient Instructions (Signed)
Your physician wants you to follow-up in: 1 year with Dr. Koneswaran.  You will receive a reminder letter in the mail two months in advance. If you don't receive a letter, please call our office to schedule the follow-up appointment.  Your physician recommends that you continue on your current medications as directed. Please refer to the Current Medication list given to you today.  Thank you for choosing [facility]!   

## 2014-06-11 NOTE — Progress Notes (Signed)
Patient ID: JENEANE PIECZYNSKI, female   DOB: Sep 12, 1981, 33 y.o.   MRN: [id number]       CARDIOLOGY CONSULT NOTE  Patient ID: DIMOND CROTTY MRN: [id number] DOB/AGE: 1980/10/31 33 y.o.  Admit date: (Not on file) Primary Physician HAWKINS,EDWARD L, MD  Reason for Consultation: arrhythmia  HPI: The patient is a 33 year old woman with a past medical history significant for an arrhythmia. She had reportedly seen a cardiologist several years ago and no treatment was recommended at that time other than fish oil, and was told to avoid triggering factors. An echocardiogram in October 2006 was normal. She has had recurrent tonsillitis and reportedly has 3-4 episodes of strep throat every year. She has frequent tonsillar stones and halitosis and has adenotonsillar hypertrophy. Adenotonsillectomy has been recommended and she presents for preoperative cardiovascular risk stratification. She denies any exertional chest pain or shortness of breath. 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 She takes the stairs from the ground to the third floor and may have some mild dyspnea which recovers immediately. She denies orthopnea, leg swelling and paroxysmal nocturnal dyspnea. She has palpitations approximately twice a month and lasting 30 seconds at the longest. She may have some mild dizziness with this but denies any presyncope or syncope. She has 3 young children whom she takes care of without difficulties.  ECG today demonstrates normal sinus rhythm, heart rate 83 beats per minute.  Fam: Father has a pacemaker.   Allergies  Allergen Reactions  . Epinephrine     Arrythmia   . 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 Penicillins     Pt has taken amoxicillin with reaction    Current Outpatient Prescriptions  Medication Sig Dispense Refill  . citalopram (CELEXA) 20 MG tablet takes half a tab daily      . ibuprofen (ADVIL,MOTRIN)  100 MG tablet Take 2 tablets (200 mg total) by mouth every 6 (six) hours as needed for fever.  30 tablet  3  . levonorgestrel (MIRENA) 20 MCG/24HR IUD 1 each by Intrauterine route once.       No current facility-administered medications for this visit.    Past Medical History  Diagnosis Date  . Anxiety   . Abnormal finding on Pap smear, ASCUS     positive for HPV on Pap  . HPV (human papilloma virus) infection   . Panic attack   . Abnormal Pap smear   . Anemia   . IUD (intrauterine device) in place 01/25/2013  . Ventricular dysrhythmia   . Vaginal Pap smear, abnormal     Past Surgical History  Procedure Laterality Date  . No past surgeries      History   Social History  . Marital Status: Single    Spouse Name: N/A    Number of Children: N/A  . Years of Education: N/A   Occupational History  . Not on file.   Social History Main Topics  . Smoking status: Never Smoker   . Smokeless tobacco: Never Used  . Alcohol Use: No  . Drug Use: No  . Sexual Activity: Yes    Birth Control/ Protection: IUD   Other Topics Concern  . Not on file   Social History Narrative  . No narrative on file       Prior to Admission medications   Medication Sig Start Date End Date Taking? Authorizing Provider  citalopram (CELEXA) 20 MG tablet  takes half a tab daily 03/15/14  Yes Adline Potter, NP  ibuprofen (ADVIL,MOTRIN) 100 MG tablet Take 2 tablets (200 mg total) by mouth every 6 (six) hours as needed for fever. 01/25/13  Yes Adline Potter, NP  levonorgestrel (MIRENA) 20 MCG/24HR IUD 1 each by Intrauterine route once.   Yes Historical Provider, MD     Review of systems complete and found to be negative unless listed above in HPI   BP 98/72 Pulse 67 Resp Temp Temp src SpO2 98% Weight 173 lb (78.472 kg) Height  (1.651 m)    Physical exam  General: NAD Neck: No JVD, no thyromegaly or thyroid nodule.  Lungs: Clear to auscultation bilaterally with normal respiratory  effort. CV: Nondisplaced PMI. Regular rate and rhythm, normal S1/S2, no S3/S4, no murmur.  No peripheral edema.  No carotid bruit.  Normal pedal pulses.  Abdomen: Soft, nontender, no hepatosplenomegaly, no distention.  Skin: Intact without lesions or rashes.  Neurologic: Alert and oriented x 3.  Psych: Normal affect. Extremities: No clubbing or cyanosis.  HEENT: Normal.   ECG: Most recent ECG reviewed.  Labs:   Lab Results  Component Value Date   WBC 6.8 11/06/2010   HGB 10.9* 11/06/2010   HCT 34.9* 11/06/2010   MCV 82.3 11/06/2010   PLT 136* 11/06/2010   No results found for this basename: NA, K, CL, CO2, BUN, CREATININE, CALCIUM, LABALBU, PROT, BILITOT, ALKPHOS, ALT, AST, GLUCOSE,  in the last 168 hours No results found for this basename: CKTOTAL, CKMB, CKMBINDEX, TROPONINI    No results found for this basename: CHOL   No results found for this basename: HDL   No results found for this basename: LDLCALC   No results found for this basename: TRIG   No results found for this basename: CHOLHDL   No results found for this basename: LDLDIRECT         Studies: No results found.  ASSESSMENT AND PLAN:  1. Preoperative risk stratification: Given the benign nature of her symptoms with palpitations which are self limiting and no limitations in activities of daily living, I do not feel any preoperative cardiovascular testing is warranted at this time. She appears to be at a low risk for major adverse perioperative cardiovascular events for planned surgery. 2. Arrhythmia: Palpitations are self limiting with benign symptoms overall. I have informed her should date become more severe or increase in frequency, I would then consider event monitoring. I do not feel cardiovascular testing is warranted at this time nor medical therapy.  Dispo: f/u 1 year.   Signed: Prentice Docker, M.D., F.A.C.C.  06/11/2014, 8:50 AM

## 2014-06-12 ENCOUNTER — Telehealth: Payer: Self-pay | Admitting: Obstetrics & Gynecology

## 2014-06-12 NOTE — Telephone Encounter (Signed)
I agree with patient and would like to do a combined surgery with Dr Suszanne Conners, 2 anesthesias for these 2 cases does not make sense to me I need to use the laser which means it would need to be done at [facility], don't think they have it at other centers

## 2014-06-12 NOTE — Telephone Encounter (Signed)
Pt states Cardiologist has cleared her for surgery with Dr. Despina Hidden. Pt states spoke with Dr. Avel Sensor office in regards to having tonsils removed and cervical laser surgery at same time but was told by Dr. Avel Sensor office "he would not do that." Pt concerned about being put to sleep twice for these two surgeries. Please advise.

## 2014-06-13 NOTE — Telephone Encounter (Signed)
Informed pt of Dr. Despina Hidden preference to do both surgeries at the same time due to anesthesia. Appt made with Dr. Despina Hidden for tomorrow at 12 pm to discuss further.

## 2014-06-14 ENCOUNTER — Ambulatory Visit (INDEPENDENT_AMBULATORY_CARE_PROVIDER_SITE_OTHER): Payer: 59 | Admitting: Obstetrics & Gynecology

## 2014-06-14 ENCOUNTER — Encounter: Payer: Self-pay | Admitting: Obstetrics & Gynecology

## 2014-06-14 VITALS — BP 100/70 | Wt 172.0 lb

## 2014-06-14 DIAGNOSIS — D069 Carcinoma in situ of cervix, unspecified: Secondary | ICD-10-CM

## 2014-06-19 NOTE — Progress Notes (Signed)
Patient ID: Heather [name], female   DOB: January 18, 1981, 33 y.o.   MRN: [id number] Pt needs a holmium laser conization for HSIL Pt also needs a tonsillectomy Would like to do combined surgery Will attempt to contact with dr Suszanne Connersteoh

## 2014-06-21 ENCOUNTER — Telehealth: Payer: Self-pay | Admitting: Obstetrics & Gynecology

## 2014-06-22 NOTE — Telephone Encounter (Signed)
No pt already has her appointment  With dr Suszanne Connersteoh we are trying to coordinate a combined surgery

## 2014-06-25 NOTE — Telephone Encounter (Signed)
Pt aware Dr. Despina HiddenEure is trying to coordinate a combined surgery with Dr. Suszanne Connerseoh.

## 2014-06-28 ENCOUNTER — Ambulatory Visit (INDEPENDENT_AMBULATORY_CARE_PROVIDER_SITE_OTHER): Payer: 59 | Admitting: Otolaryngology

## 2014-07-19 ENCOUNTER — Ambulatory Visit (INDEPENDENT_AMBULATORY_CARE_PROVIDER_SITE_OTHER): Payer: 59 | Admitting: Otolaryngology

## 2014-07-19 DIAGNOSIS — J3501 Chronic tonsillitis: Secondary | ICD-10-CM

## 2014-07-23 ENCOUNTER — Encounter: Payer: Self-pay | Admitting: Obstetrics & Gynecology

## 2014-07-25 ENCOUNTER — Telehealth: Payer: Self-pay | Admitting: Obstetrics & Gynecology

## 2014-07-27 ENCOUNTER — Telehealth: Payer: Self-pay | Admitting: *Deleted

## 2014-07-27 NOTE — Telephone Encounter (Signed)
Pt states Dr. Suszanne Connerseoh has agreed to do the surgery in conjunction with Dr. Despina HiddenEure. Pt state Dr. Avel Sensoreoh's office is requesting Dr. Despina HiddenEure to call their office to schedule [phone].

## 2014-07-27 NOTE — Telephone Encounter (Signed)
Informed pt Dr. Despina HiddenEure attempted to contact Dr. Avel Sensoreoh's office but was closed for lunch will follow up next week. Pt verbalized understanding.

## 2014-08-03 ENCOUNTER — Other Ambulatory Visit: Payer: Self-pay | Admitting: Otolaryngology

## 2014-08-13 ENCOUNTER — Telehealth: Payer: Self-pay | Admitting: Obstetrics & Gynecology

## 2014-08-13 DIAGNOSIS — Z029 Encounter for administrative examinations, unspecified: Secondary | ICD-10-CM

## 2014-08-13 NOTE — Telephone Encounter (Signed)
Pt to bring FMLA forms to the office today and pay $29.00 form fee and will fax to employer before the end of the week.

## 2014-08-20 ENCOUNTER — Encounter (HOSPITAL_BASED_OUTPATIENT_CLINIC_OR_DEPARTMENT_OTHER): Payer: Self-pay | Admitting: *Deleted

## 2014-08-24 ENCOUNTER — Other Ambulatory Visit: Payer: Self-pay | Admitting: Obstetrics & Gynecology

## 2014-08-27 ENCOUNTER — Ambulatory Visit (HOSPITAL_BASED_OUTPATIENT_CLINIC_OR_DEPARTMENT_OTHER): Payer: 59 | Admitting: Anesthesiology

## 2014-08-27 ENCOUNTER — Ambulatory Visit (HOSPITAL_BASED_OUTPATIENT_CLINIC_OR_DEPARTMENT_OTHER)
Admission: RE | Admit: 2014-08-27 | Discharge: 2014-08-27 | Disposition: A | Discharge status: Home / Self Care | Payer: 59 | Source: Ambulatory Visit | Attending: Otolaryngology | Admitting: Otolaryngology

## 2014-08-27 ENCOUNTER — Encounter (HOSPITAL_BASED_OUTPATIENT_CLINIC_OR_DEPARTMENT_OTHER)
Admission: RE | Disposition: A | Discharge status: Home / Self Care | Payer: Self-pay | Source: Ambulatory Visit | Attending: Otolaryngology

## 2014-08-27 ENCOUNTER — Encounter (HOSPITAL_BASED_OUTPATIENT_CLINIC_OR_DEPARTMENT_OTHER): Payer: Self-pay | Admitting: *Deleted

## 2014-08-27 DIAGNOSIS — J352 Hypertrophy of adenoids: Secondary | ICD-10-CM | POA: Insufficient documentation

## 2014-08-27 DIAGNOSIS — J3501 Chronic tonsillitis: Secondary | ICD-10-CM | POA: Diagnosis not present

## 2014-08-27 DIAGNOSIS — J312 Chronic pharyngitis: Secondary | ICD-10-CM | POA: Insufficient documentation

## 2014-08-27 DIAGNOSIS — R87613 High grade squamous intraepithelial lesion on cytologic smear of cervix (HGSIL): Secondary | ICD-10-CM | POA: Insufficient documentation

## 2014-08-27 DIAGNOSIS — F419 Anxiety disorder, unspecified: Secondary | ICD-10-CM | POA: Diagnosis not present

## 2014-08-27 DIAGNOSIS — D067 Carcinoma in situ of other parts of cervix: Secondary | ICD-10-CM

## 2014-08-27 HISTORY — DX: Palpitations: R00.2

## 2014-08-27 HISTORY — PX: CO2 LASER APPLICATION: SHX5778

## 2014-08-27 HISTORY — PX: CERVICAL CONIZATION W/BX: SHX1330

## 2014-08-27 HISTORY — DX: Hypertrophy of tonsils with hypertrophy of adenoids: J35.3

## 2014-08-27 HISTORY — PX: TONSILLECTOMY AND ADENOIDECTOMY: SHX28

## 2014-08-27 HISTORY — DX: Streptococcal pharyngitis: J02.0

## 2014-08-27 LAB — URINALYSIS, ROUTINE W REFLEX MICROSCOPIC
Bilirubin Urine: NEGATIVE
Glucose, UA: NEGATIVE mg/dL
Hgb urine dipstick: NEGATIVE
Ketones, ur: NEGATIVE mg/dL
LEUKOCYTES UA: NEGATIVE
Nitrite: NEGATIVE
PH: 6 (ref 5.0–8.0)
Protein, ur: NEGATIVE mg/dL
Specific Gravity, Urine: 1.025 (ref 1.005–1.030)
UROBILINOGEN UA: 0.2 mg/dL (ref 0.0–1.0)

## 2014-08-27 LAB — CBC
HCT: 42.2 % (ref 36.0–46.0)
HEMOGLOBIN: 14.4 g/dL (ref 12.0–15.0)
MCH: 29.3 pg (ref 26.0–34.0)
MCHC: 34.1 g/dL (ref 30.0–36.0)
MCV: 85.9 fL (ref 78.0–100.0)
Platelets: 163 10*3/uL (ref 150–400)
RBC: 4.91 MIL/uL (ref 3.87–5.11)
RDW: 11.8 % (ref 11.5–15.5)
WBC: 6.3 10*3/uL (ref 4.0–10.5)

## 2014-08-27 LAB — HCG, QUANTITATIVE, PREGNANCY: hCG, Beta Chain, Quant, S: <1 m[IU]/mL (ref <5)

## 2014-08-27 SURGERY — TONSILLECTOMY AND ADENOIDECTOMY
Anesthesia: General | Site: Throat

## 2014-08-27 SURGERY — CONE BIOPSY, CERVIX
Anesthesia: Choice

## 2014-08-27 MED ORDER — CEFAZOLIN SODIUM-DEXTROSE 2-3 GM-% IV SOLR
INTRAVENOUS | Status: AC
  Filled 2014-08-27: qty 50

## 2014-08-27 MED ORDER — FENTANYL CITRATE 0.05 MG/ML IJ SOLN: 50.0000 ug | INTRAMUSCULAR | Status: DC | PRN

## 2014-08-27 MED ORDER — OXYCODONE HCL 5 MG/5ML PO SOLN
ORAL | Status: AC
Start: 2014-08-27 — End: 2014-08-27
  Filled 2014-08-27: qty 5

## 2014-08-27 MED ORDER — ONDANSETRON HCL 4 MG/2ML IJ SOLN: 4.0000 mg | Freq: Once | INTRAMUSCULAR | Status: DC | PRN

## 2014-08-27 MED ORDER — FENTANYL CITRATE 0.05 MG/ML IJ SOLN
INTRAMUSCULAR | Status: AC
  Filled 2014-08-27: qty 4

## 2014-08-27 MED ORDER — BACITRACIN ZINC 500 UNIT/GM EX OINT
TOPICAL_OINTMENT | CUTANEOUS | Status: AC
  Filled 2014-08-27: qty 0.9

## 2014-08-27 MED ORDER — DEXAMETHASONE SODIUM PHOSPHATE 4 MG/ML IJ SOLN
INTRAMUSCULAR | Status: DC | PRN
  Administered 2014-08-27: 10 mg via INTRAVENOUS

## 2014-08-27 MED ORDER — OXYCODONE HCL 5 MG PO TABS: 5.0000 mg | ORAL_TABLET | Freq: Once | ORAL | Status: AC | PRN

## 2014-08-27 MED ORDER — CEFAZOLIN SODIUM-DEXTROSE 2-3 GM-% IV SOLR
2.0000 g | INTRAVENOUS | Status: AC
  Administered 2014-08-27: 2 g via INTRAVENOUS

## 2014-08-27 MED ORDER — ONDANSETRON HCL 4 MG/2ML IJ SOLN
INTRAMUSCULAR | Status: DC | PRN
Start: 2014-08-27 — End: 2014-08-27
  Administered 2014-08-27: 4 mg via INTRAVENOUS

## 2014-08-27 MED ORDER — MIDAZOLAM HCL 2 MG/2ML IJ SOLN: 1.0000 mg | INTRAMUSCULAR | Status: DC | PRN

## 2014-08-27 MED ORDER — FENTANYL CITRATE 0.05 MG/ML IJ SOLN
INTRAMUSCULAR | Status: DC | PRN
  Administered 2014-08-27: 100 ug via INTRAVENOUS
  Administered 2014-08-27: 50 ug via INTRAVENOUS
  Administered 2014-08-27 (×2): 25 ug via INTRAVENOUS

## 2014-08-27 MED ORDER — MIDAZOLAM HCL 5 MG/5ML IJ SOLN
INTRAMUSCULAR | Status: DC | PRN
  Administered 2014-08-27: 2 mg via INTRAVENOUS

## 2014-08-27 MED ORDER — SODIUM CHLORIDE 0.9 % IR SOLN
Status: DC | PRN
  Administered 2014-08-27: 500 mL

## 2014-08-27 MED ORDER — LIDOCAINE HCL (CARDIAC) 10 MG/ML IV SOLN
INTRAVENOUS | Status: DC | PRN
  Administered 2014-08-27: 100 mg via INTRAVENOUS

## 2014-08-27 MED ORDER — SUCCINYLCHOLINE CHLORIDE 20 MG/ML IJ SOLN
INTRAMUSCULAR | Status: DC | PRN
  Administered 2014-08-27: 100 mg via INTRAVENOUS

## 2014-08-27 MED ORDER — MIDAZOLAM HCL 2 MG/2ML IJ SOLN
INTRAMUSCULAR | Status: AC
  Filled 2014-08-27: qty 2

## 2014-08-27 MED ORDER — OXYMETAZOLINE HCL 0.05 % NA SOLN
NASAL | Status: AC
  Filled 2014-08-27: qty 15

## 2014-08-27 MED ORDER — FERRIC SUBSULFATE SOLN
Status: DC | PRN
  Administered 2014-08-27: 1 via TOPICAL

## 2014-08-27 MED ORDER — AMOXICILLIN 400 MG/5ML PO SUSR: 800.0000 mg | Freq: Two times a day (BID) | ORAL | Status: AC

## 2014-08-27 MED ORDER — MIDAZOLAM HCL 2 MG/ML PO SYRP: 12.0000 mg | ORAL_SOLUTION | Freq: Once | ORAL | Status: DC | PRN

## 2014-08-27 MED ORDER — PROMETHAZINE HCL 25 MG/ML IJ SOLN
6.2500 mg | INTRAMUSCULAR | Status: DC | PRN
  Administered 2014-08-27: 6.25 mg via INTRAVENOUS

## 2014-08-27 MED ORDER — OXYCODONE HCL 5 MG/5ML PO SOLN
5.0000 mg | Freq: Once | ORAL | Status: AC | PRN
  Administered 2014-08-27: 5 mg via ORAL

## 2014-08-27 MED ORDER — HYDROCODONE-ACETAMINOPHEN 7.5-325 MG/15ML PO SOLN: 15.0000 mL | ORAL | Status: DC | PRN

## 2014-08-27 MED ORDER — PROPOFOL 10 MG/ML IV BOLUS
INTRAVENOUS | Status: DC | PRN
  Administered 2014-08-27: 200 mg via INTRAVENOUS
  Administered 2014-08-27: 20 mg via INTRAVENOUS

## 2014-08-27 MED ORDER — HYDROMORPHONE HCL 1 MG/ML IJ SOLN
INTRAMUSCULAR | Status: AC
  Filled 2014-08-27: qty 1

## 2014-08-27 MED ORDER — LACTATED RINGERS IV SOLN
INTRAVENOUS | Status: DC
Start: 2014-08-27 — End: 2014-08-27
  Administered 2014-08-27 (×2): via INTRAVENOUS

## 2014-08-27 MED ORDER — PROMETHAZINE HCL 25 MG/ML IJ SOLN
INTRAMUSCULAR | Status: AC
  Filled 2014-08-27: qty 1

## 2014-08-27 MED ORDER — OXYMETAZOLINE HCL 0.05 % NA SOLN
NASAL | Status: DC | PRN
  Administered 2014-08-27: 1

## 2014-08-27 MED ORDER — HYDROMORPHONE HCL 1 MG/ML IJ SOLN
0.2500 mg | INTRAMUSCULAR | Status: DC | PRN
  Administered 2014-08-27: 0.25 mg via INTRAVENOUS

## 2014-08-27 MED ORDER — ACETIC ACID 5 % SOLN
Status: DC | PRN
  Administered 2014-08-27: 1 via TOPICAL

## 2014-08-27 SURGICAL SUPPLY — 58 items
BANDAGE COBAN STERILE 2 (GAUZE/BANDAGES/DRESSINGS) IMPLANT
BLADE SURG 15 STRL LF DISP TIS (BLADE) ×2 IMPLANT
BLADE SURG 15 STRL SS (BLADE) ×3
BRIEF STRETCH FOR OB PAD LRG (UNDERPADS AND DIAPERS) ×3 IMPLANT
CANISTER SUCT 1200ML W/VALVE (MISCELLANEOUS) ×3 IMPLANT
CATH ROBINSON RED A/P 10FR (CATHETERS) IMPLANT
CATH ROBINSON RED A/P 14FR (CATHETERS) ×1 IMPLANT
COAGULATOR SUCT SWTCH 10FR 6 (ELECTROSURGICAL) IMPLANT
COVER MAYO STAND STRL (DRAPES) ×3 IMPLANT
DECANTER SPIKE VIAL GLASS SM (MISCELLANEOUS) IMPLANT
DEPRESSOR TONGUE BLADE STERILE (MISCELLANEOUS) ×3 IMPLANT
DRSG PAD ABDOMINAL 8X10 ST (GAUZE/BANDAGES/DRESSINGS) ×3 IMPLANT
ELECT REM PT RETURN 9FT ADLT (ELECTROSURGICAL) ×6
ELECT REM PT RETURN 9FT PED (ELECTROSURGICAL)
ELECTRODE REM PT RETRN 9FT PED (ELECTROSURGICAL) IMPLANT
ELECTRODE REM PT RTRN 9FT ADLT (ELECTROSURGICAL) ×2 IMPLANT
FILTER 7/8 IN (FILTER) ×3 IMPLANT
GLOVE BIO SURGEON STRL SZ7.5 (GLOVE) ×3 IMPLANT
GLOVE BIOGEL PI IND STRL 6.5 (GLOVE) IMPLANT
GLOVE BIOGEL PI IND STRL 8 (GLOVE) ×2 IMPLANT
GLOVE BIOGEL PI INDICATOR 6.5 (GLOVE) ×2
GLOVE BIOGEL PI INDICATOR 8 (GLOVE) ×1
GLOVE ECLIPSE 6.5 STRL STRAW (GLOVE) ×2 IMPLANT
GOWN STRL REUS W/ TWL LRG LVL3 (GOWN DISPOSABLE) ×4 IMPLANT
GOWN STRL REUS W/TWL LRG LVL3 (GOWN DISPOSABLE) ×8 IMPLANT
GOWN STRL REUS W/TWL XL LVL3 (GOWN DISPOSABLE) ×3 IMPLANT
IV NS 500ML (IV SOLUTION) ×3
IV NS 500ML BAXH (IV SOLUTION) ×2 IMPLANT
MANIFOLD NEPTUNE II (INSTRUMENTS) ×2 IMPLANT
MARKER SKIN DUAL TIP RULER LAB (MISCELLANEOUS) IMPLANT
NDL HYPO 25X1 1.5 SAFETY (NEEDLE) IMPLANT
NEEDLE HYPO 25X1 1.5 SAFETY (NEEDLE) IMPLANT
NS IRRIG 1000ML POUR BTL (IV SOLUTION) ×3 IMPLANT
PACK BASIN DAY SURGERY FS (CUSTOM PROCEDURE TRAY) ×3 IMPLANT
PACK LITHOTOMY IV (CUSTOM PROCEDURE TRAY) ×2 IMPLANT
PENCIL BUTTON HOLSTER BLD 10FT (ELECTRODE) IMPLANT
PLASMABLADE SUCTION COAG TIP (TIP) IMPLANT
PLASMABLADE TNA (BLADE) IMPLANT
SHEET MEDIUM DRAPE 40X70 STRL (DRAPES) ×3 IMPLANT
SOLUTION BUTLER CLEAR DIP (MISCELLANEOUS) ×3 IMPLANT
SPONGE GAUZE 4X4 12PLY STER LF (GAUZE/BANDAGES/DRESSINGS) ×3 IMPLANT
SPONGE TONSIL 1 RF SGL (DISPOSABLE) IMPLANT
SPONGE TONSIL 1.25 RF SGL STRG (GAUZE/BANDAGES/DRESSINGS) ×1 IMPLANT
SURGILUBE 2OZ TUBE FLIPTOP (MISCELLANEOUS) IMPLANT
SUT VIC AB 0 CT1 27 (SUTURE) ×3
SUT VIC AB 0 CT1 27XBRD ANBCTR (SUTURE) ×2 IMPLANT
SYR BULB 3OZ (MISCELLANEOUS) ×1 IMPLANT
SYR CONTROL 10ML LL (SYRINGE) ×2 IMPLANT
TOWEL OR 17X24 6PK STRL BLUE (TOWEL DISPOSABLE) ×6 IMPLANT
TRAY DSU PREP LF (CUSTOM PROCEDURE TRAY) ×3 IMPLANT
TUBE CONNECTING 20X1/4 (TUBING) ×6 IMPLANT
TUBE SALEM SUMP 12R W/ARV (TUBING) IMPLANT
TUBE SALEM SUMP 16 FR W/ARV (TUBING) ×1 IMPLANT
UNDERPAD 30X30 INCONTINENT (UNDERPADS AND DIAPERS) ×3 IMPLANT
VAC PENCILS W/TUBING CLEAR (MISCELLANEOUS) IMPLANT
WAND COBLATOR 70 EVAC XTRA (SURGICAL WAND) ×1 IMPLANT
WATER STERILE IRR 1000ML POUR (IV SOLUTION) ×3 IMPLANT
YANKAUER SUCT BULB TIP NO VENT (SUCTIONS) ×3 IMPLANT

## 2014-08-27 NOTE — H&P (Signed)
Cc: Chronic tonsillitis, pharyngitis  HPI: The patient is a 33 y/o female who returns today for follow up evaluation of recurrent tonsillitis and frequent tonsilloliths. She was last seen on 05/17/2014. At that time, the patient was noted to tonsillar hypertrophy and history consistent with chronic tonsillitis.  Options were discussed and the patient wished to proceed with surgical intervention, but due to her heart issues her surgery was deferred.  According to the patient, the cardiologist did clear her for surgery but she also needs an OB/GYN procedure done as well and he only wants her to undergo anesthesia once if possible.  [place] '[place]  No other ENT, GI, or respiratory issue noted since the last visit.   Exam General: Communicates without difficulty, well nourished, no acute distress. Head:  Normocephalic, no lesions or asymmetry. Eyes: PERRL, EOMI. No scleral icterus, conjunctivae clear. Neuro: CN II exam reveals vision grossly intact. No nystagmus at any point of gaze. Ears:  EAC normal without erythema AU. TM intact without fluid and mobile AU. Nose: Moist, pink mucosa without lesions or mass. Mouth: Oral cavity clear and moist, no lesions, tonsils symmetric. Tonsils are 2+, cryptic. Tonsils free of erythema and exudate today. Neck: Full range of motion, no lymphadenopathy or masses. Neuro:  CN 2-12 grossly intact. Gait normal. Vestibular: No nystagmus at any point of gaze. Vestibular: No nystagmus at any point of gaze.   Assessment 1.  The patient's history and physical exam findings are consistent with recurrent tonsillitis/pharyngitis secondary to adenotonsillar hypertrophy. 2.  Cardiac clearance has been obtained.  Plan  1. Based on the patient's history and physical exam findings, the patient may benefit from having the tonsils and possibly the adenoid removed. The risks, benefits, alternatives, and details of the procedure are reviewed  with the patient. Questions are invited and answered. 2. We will coordinate with Dr. Forestine ChuteEure's office and [facility].

## 2014-08-27 NOTE — Op Note (Signed)
Preoperative Diagnosis:  High Grade Squamous Intraepithelial lesion, adequate colposcopy  Postoperative Diagnosis:  Same as above  Procedure:  Laser ablation of the cervix  Surgeon:  Lazaro ArmsLuther H Amarilys Lyles MD  Anaesthesia:  Laryngeal Mask Airway  Findings:  Patient had an abnormal pap smear which was evaluated in the office with colposcopy and directed biopsies.  Pathology report returned as high Grade SIL.  The colposcopy was adequate.  As a result, the patient is admitted for laser ablation of the cervix.  Description of Note:  Patient was taken to the OR and placed in the supine position where she underwent laryngeal mask airway anaesthesia.  She was placed in the dorsal lithotomy position.  She was draped for laser.  Graves speculum was placed and 3% acetic acid used and the laser microscope employed to perform colposcopy which confirmed the office findings.  Laser was used on typical cervical settings and used to vaporized the squamocolumnar junction to  depth of  5-7 mm peripherally and 7-9 mm centrally.  Surgical margin of several mm was employed beyond the acetowhite epithelium.  Hemostasis was achieved with the laser and Monsel's solution.  Patient was awakened from anaesthesia in good stable condition and all counts were correct.  She received Ancef 2 gram and Toradol 30 mg IV preoperatively prophylactically.  Jerrion Tabbert H 08/27/2014 11:29 AM

## 2014-08-27 NOTE — Anesthesia Postprocedure Evaluation (Signed)
  Anesthesia Post-op Note  Patient: Heather [name]  Procedure(s) Performed: Procedure(s) with comments: BILATERAL TONSILLECTOMY AND ADENOIDECTOMY (Bilateral) CONIZATION CERVIX  (N/A) - Laser conization and ablation of cervix CO2 LASER APPLICATION (N/A)  Patient Location: PACU  Anesthesia Type: General   Level of Consciousness: awake, alert  and oriented  Airway and Oxygen Therapy: Patient Spontanous Breathing  Post-op Pain: mild  Post-op Assessment: Post-op Vital signs reviewed  Post-op Vital Signs: Reviewed  Last Vitals:  Filed Vitals:   08/27/14 1315  BP: 112/65  Pulse: 69  Temp:   Resp: 15    Complications: No apparent anesthesia complications

## 2014-08-27 NOTE — Discharge Instructions (Addendum)
Conization of the Cervix, Care After Refer to this sheet in the next few weeks. These instructions provide you with information on caring for yourself after your procedure. Your health care provider may also give you more specific instructions. Your treatment has been planned according to current medical practices but problems sometimes occur. Call your health care provider if you have any problems or questions after your procedure. WHAT TO EXPECT AFTER THE PROCEDURE After your procedure, it is typical to have the following sensations:  If you had a general anesthetic, you may be groggy for 2-3 hours after the procedure.  You may have cramps (similar to menstrual cramps) for about 1 week.   You may have a bloody discharge or light to moderate bleeding for 1-2 weeks. The bleeding should not be heavy (for example, it should not soak 1 pad in less than 1 hour).  You may have a black vaginal discharge that looks similar to coffee grounds. This is from the paste that was applied to the cervix to control bleeding. This is normal. Recovery may take up to 3 weeks.  HOME CARE INSTRUCTIONS   Arrange for someone to drive you home after the procedure.  Only take medicines as directed by your health care provider. Do not take aspirin. It can cause bleeding.   Take showers for the first week. Do not take baths, swim, or use hot tubs until your health care provider says it is okay.   Do not douche, use tampons, or have sexual intercourse until your health care provider says it is okay.   Avoid strenuous activities, exercises, and heavy lifting for at least 7-14 days.  You may resume your normal diet unless your health care provider advises you differently.    If you are constipated, you may:  Take a mild laxative as directed by your health care provider.   Add fruit and bran to your diet.   Make sure to drink enough fluids to keep your urine clear or pale yellow.  Keep follow-up  appointments with your health care provider. SEEK MEDICAL CARE IF:   You develop a rash.   You are dizzy or lightheaded.   You feel nauseous.   You develop a bad smelling vaginal discharge. SEEK IMMEDIATE MEDICAL CARE IF:   You have blood clots or bleeding that is heavier than a normal menstrual period (for example, soaking a pad in less than 1 hour) or you develop bright red bleeding.   You have a fever over 101F (38.3C) or persistent symptoms for more than 2-3 days.   You have a fever over 101F (38.3C) and your symptoms suddenly get worse.  You have increasing cramps.   You faint.   You have pain when urinating.  You have bloody urine.   You start vomiting.   Your pain is not relieved with your medicine.   Your have severe or worsening pain. MAKE SURE YOU:  Understand these instructions.  Will watch your condition.  Will get help right away if you are not doing well or get worse. Document Released: 09/07/2005 Document Revised: 09/12/2013 Document Reviewed: 03/03/2013 [facility] Patient Information 2015 [employer] [place]. This information is not intended to replace advice given to you by your health care provider. Make sure you discuss any questions you have with your health care provider.  ------------------------  SU Philomena DohenyWOOI TEOH M.D., P.A. Postoperative Instructions for Tonsillectomy & Adenoidectomy (T&A) Activity Restrict activity at home for the first two days, resting as much as possible. Light  indoor activity is best. You may usually return to school or work within a week but void strenuous activity and sports for two weeks. Sleep with your head elevated on 2-3 pillows for 3-4 days to help decrease swelling. Diet Due to tissue swelling and throat discomfort, you may have little desire to drink for several days. However fluids are very important to prevent dehydration. You will find that non-acidic juices, soups, popsicles, Jell-O, custard, puddings, and  any soft or mashed foods taken in small quantities can be swallowed fairly easily. Try to increase your fluid and food intake as the discomfort subsides. It is recommended that a child receive 1-1/2 quarts of fluid in a 24-hour period. Adult require twice this amount.  Discomfort Your sore throat may be relieved by applying an ice collar to your neck and/or by taking Tylenol. You may experience an earache, which is due to referred pain from the throat. Referred ear pain is commonly felt at night when trying to rest.  Bleeding                        Although rare, there is risk of having some bleeding during the first 2 weeks after having a T&A. This usually happens between days 7-10 postoperatively. If you or your child should have any bleeding, try to remain calm. We recommend sitting up quietly in a chair and gently spitting out the blood into a bowl. For adults, gargling gently with ice water may help. If the bleeding does not stop after a short time (5 minutes), is more than 1 teaspoonful, or if you become worried, please call our office at [phone] or go directly to the nearest hospital emergency room. Do not eat or drink anything prior to going to the hospital as you may need to be taken to the operating room in order to control the bleeding. GENERAL CONSIDERATIONS 1. Brush your teeth regularly. Avoid mouthwashes and gargles for three weeks. You may gargle gently with warm salt-water as necessary or spray with Chloraseptic. You may make salt-water by placing 2 teaspoons of table salt into a quart of fresh water. Warm the salt-water in a microwave to a luke warm temperature.  2. Avoid exposure to colds and upper respiratory infections if possible.  3. If you look into a mirror or into your child's mouth, you will see white-gray patches in the back of the throat. This is normal after having a T&A and is like a scab that forms on the skin after an abrasion. It will disappear once the back of the  throat heals completely. However, it may cause a noticeable odor; this too will disappear with time. Again, warm salt-water gargles may be used to help keep the throat clean and promote healing.  4. You may notice a temporary change in voice quality, such as a higher pitched voice or a nasal sound, until healing is complete. This may last for 1-2 weeks and should resolve.  5. Do not take or give you child any medications that we have not prescribed or recommended.  6. Snoring may occur, especially at night, for the first week after a T&A. It is due to swelling of the soft palate and will usually resolve.  Please call our office at [phone] if you have any questions.     Post Anesthesia Home Care Instructions  Activity: Get plenty of rest for the remainder of the day. A responsible adult should stay with you for 24  hours following the procedure.  For the next 24 hours, DO NOT: -Drive a car -Paediatric nurse -Drink alcoholic beverages -Take any medication unless instructed by your physician -Make any legal decisions or sign important papers.  Meals: Start with liquid foods such as gelatin or soup. Progress to regular foods as tolerated. Avoid greasy, spicy, heavy foods. If nausea and/or vomiting occur, drink only clear liquids until the nausea and/or vomiting subsides. Call your physician if vomiting continues.  Special Instructions/Symptoms: Your throat may feel dry or sore from the anesthesia or the breathing tube placed in your throat during surgery. If this causes discomfort, gargle with warm salt water. The discomfort should disappear within 24 hours.

## 2014-08-27 NOTE — Anesthesia Procedure Notes (Signed)
Procedure Name: Intubation Date/Time: 08/27/2014 10:40 AM Performed by: Gar GibbonKEETON, Rhyse Loux S Pre-anesthesia Checklist: Patient identified, Emergency Drugs available, Suction available and Patient being monitored Patient Re-evaluated:Patient Re-evaluated prior to inductionOxygen Delivery Method: Circle System Utilized Preoxygenation: Pre-oxygenation with 100% oxygen Intubation Type: IV induction Ventilation: Mask ventilation without difficulty Laryngoscope Size: Mac and 3 Tube type: Oral Tube size: 7.0 mm Number of attempts: 1 Airway Equipment and Method: stylet and oral airway Placement Confirmation: ETT inserted through vocal cords under direct vision,  positive ETCO2 and breath sounds checked- equal and bilateral Tube secured with: Tape Dental Injury: Teeth and Oropharynx as per pre-operative assessment

## 2014-08-27 NOTE — H&P (Signed)
Preoperative History and Physical  Heather [name] is a 33 y.o. G3P2003 with No LMP recorded. Patient is not currently having periods (Reason: IUD). admitted for a laser ablation/conization of the cervix for management of colposcopically directed biopsy diagnosis of HSIL, adequate colposcopy.    PMH:    Past Medical History  Diagnosis Date  . Anxiety   . HPV (human papilloma virus) infection   . Panic attack   . Abnormal Pap smear   . IUD (intrauterine device) in place   . Palpitations   . Adenotonsillar hypertrophy     denies snoring during sleep or apnea  . Strep throat     to start antibiotic 08/20/2014    PSH:     Past Surgical History  Procedure Laterality Date  . Tympanostomy tube placement Bilateral     POb/GynH:      OB History    Gravida Para Term Preterm AB TAB SAB Ectopic Multiple Living   3 3 2       3       SH:   History  Substance Use Topics  . Smoking status: Never Smoker   . Smokeless tobacco: Never Used  . Alcohol Use: No    FH:    Family History  Problem Relation Age of Onset  . Hypertension Mother   . Heart disease Maternal Grandmother     pacemaker  . Hypertension Father   . Heart disease Father     pacemaker  . Cancer Paternal Grandfather     colon cancer  . Stroke Paternal Grandfather   . Asthma Son   . Macular degeneration Paternal Grandmother      Allergies:  Allergies  Allergen Reactions  . Epinephrine Other (See Comments)    DUE TO PALPITATIONS  . Lidocaine Other (See Comments)    TACHYCARDIA  . Penicillins Hives    AS A CHILD - STATES CAN TAKE AMOXICILLIN    Medications:      Current facility-administered medications: ceFAZolin (ANCEF) IVPB 2 g/50 mL premix, 2 g, Intravenous, On Call to OR, Lazaro ArmsLuther H Heather Schriner, [name];  fentaNYL (SUBLIMAZE) injection 50-100 mcg, 50-100 mcg, Intravenous, PRN, Heather [name];  lactated ringers infusion, , Intravenous, Continuous, Heather [name], Last Rate: 10 mL/hr at 08/27/14 [id number];   midazolam (VERSED) 2 MG/ML syrup 12 mg, 12 mg, Oral, Once PRN, Heather [name] midazolam (VERSED) injection 1-2 mg, 1-2 mg, Intravenous, PRN, Heather [name]  Review of Systems:   Review of Systems  Constitutional: Negative for fever, chills, weight loss, malaise/fatigue and diaphoresis.  HENT: Negative for hearing loss, ear pain, nosebleeds, congestion, sore throat, neck pain, tinnitus and ear discharge.   Eyes: Negative for blurred vision, double vision, photophobia, pain, discharge and redness.  Respiratory: Negative for cough, hemoptysis, sputum production, shortness of breath, wheezing and stridor.   Cardiovascular: Negative for chest pain, palpitations, orthopnea, claudication, leg swelling and PND.  Gastrointestinal: Positive for abdominal pain. Negative for heartburn, nausea, vomiting, diarrhea, constipation, blood in stool and melena.  Genitourinary: Negative for dysuria, urgency, frequency, hematuria and flank pain.  Musculoskeletal: Negative for myalgias, back pain, joint pain and falls.  Skin: Negative for itching and rash.  Neurological: Negative for dizziness, tingling, tremors, sensory change, speech change, focal weakness, seizures, loss of consciousness, weakness and headaches.  Endo/Heme/Allergies: Negative for environmental allergies and polydipsia. Does not bruise/bleed easily.  Psychiatric/Behavioral: Negative for depression, suicidal ideas, hallucinations, memory loss and substance abuse. The patient is not nervous/anxious and  does not have insomnia.      PHYSICAL EXAM:  Blood pressure 118/74, pulse 79, temperature 98 F (36.7 C), temperature source Oral, resp. rate 18, height 5\' 5"  (1.651 m), weight 170 lb (77.111 kg), SpO2 100 %.    Vitals reviewed. Constitutional: She is oriented to person, place, and time. She appears well-developed and well-nourished.  HENT:  Head: Normocephalic and atraumatic.  Right Ear: External ear normal.  Left Ear: External ear  normal.  Nose: Nose normal.  Mouth/Throat: Oropharynx is clear and moist.  Eyes: Conjunctivae and EOM are normal. Pupils are equal, round, and reactive to light. Right eye exhibits no discharge. Left eye exhibits no discharge. No scleral icterus.  Neck: Normal range of motion. Neck supple. No tracheal deviation present. No thyromegaly present.  Cardiovascular: Normal rate, regular rhythm, normal heart sounds and intact distal pulses.  Exam reveals no gallop and no friction rub.   No murmur heard. Respiratory: Effort normal and breath sounds normal. No respiratory distress. She has no wheezes. She has no rales. She exhibits no tenderness.  GI: Soft. Bowel sounds are normal. She exhibits no distension and no mass. There is tenderness. There is no rebound and no guarding.  Genitourinary:       Vulva is normal without lesions Vagina is pink moist without discharge Cervix normal in appearance and pap is normal Uterus is enlarged to 16-18 weeks size due to multiple fibroids the largest of which is 6.1 cm Adnexa is negative with normal sized ovaries by sonogram  Musculoskeletal: Normal range of motion. She exhibits no edema and no tenderness.  Neurological: She is alert and oriented to person, place, and time. She has normal reflexes. She displays normal reflexes. No cranial nerve deficit. She exhibits normal muscle tone. Coordination normal.  Skin: Skin is warm and dry. No rash noted. No erythema. No pallor.  Psychiatric: She has a normal mood and affect. Her behavior is normal. Judgment and thought content normal.    Labs: Results for orders placed or performed during the hospital encounter of 08/27/14 (from the past 336 hour(s))  Urinalysis, Routine w reflex microscopic   Collection Time: 08/27/14  8:10 AM  Result Value Ref Range   Color, Urine YELLOW YELLOW   APPearance CLEAR CLEAR   Specific Gravity, Urine 1.025 1.005 - 1.030   pH 6.0 5.0 - 8.0   Glucose, UA NEGATIVE NEGATIVE mg/dL   Hgb  urine dipstick NEGATIVE NEGATIVE   Bilirubin Urine NEGATIVE NEGATIVE   Ketones, ur NEGATIVE NEGATIVE mg/dL   Protein, ur NEGATIVE NEGATIVE mg/dL   Urobilinogen, UA 0.2 0.0 - 1.0 mg/dL   Nitrite NEGATIVE NEGATIVE   Leukocytes, UA NEGATIVE NEGATIVE  CBC   Collection Time: 08/27/14  8:15 AM  Result Value Ref Range   WBC 6.3 4.0 - 10.5 K/uL   RBC 4.91 3.87 - 5.11 MIL/uL   Hemoglobin 14.4 12.0 - 15.0 g/dL   HCT 16.1 09.6 - 04.5 %   MCV 85.9 78.0 - 100.0 fL   MCH 29.3 26.0 - 34.0 pg   MCHC 34.1 30.0 - 36.0 g/dL   RDW 40.9 81.1 - 91.4 %   Platelets 163 150 - 400 K/uL  hCG, quantitative, pregnancy   Collection Time: 08/27/14  8:15 AM  Result Value Ref Range   hCG, Beta Chain, Quant, S <1 <5 mIU/mL    EKG: Orders placed or performed in visit on 06/11/14  . EKG 12-Lead    Imaging Studies: No results found.  Assessment: HSIL Patient Active Problem List   Diagnosis Date Noted  . Arrhythmia 06/11/2014  . 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 Strain of hip 01/22/2014  . Anxiety 01/25/2013  . History of abnormal Pap smear 01/25/2013  . IUD (intrauterine device) in place 01/25/2013    Plan: Laser conization/ablatiuon of cervix  Copeland Neisen H 08/27/2014 9:58 AM

## 2014-08-27 NOTE — Op Note (Signed)
DATE OF PROCEDURE:  08/27/2014                              OPERATIVE REPORT  SURGEON:  Newman PiesSu Gabrielle Mester, MD  PREOPERATIVE DIAGNOSES: 1. Adenotonsillar hypertrophy. 2. Chronic tonsillitis and pharyngitis  POSTOPERATIVE DIAGNOSES: 1. Adenotonsillar hypertrophy. 2. Chronic tonsillitis and pharyngitis  PROCEDURE PERFORMED:  Adenotonsillectomy.  ANESTHESIA:  General endotracheal tube anesthesia.  COMPLICATIONS:  None.  ESTIMATED BLOOD LOSS:  Minimal.  INDICATION FOR PROCEDURE:  Heather [name] is a 33 y.o. female with a history of chronic tonsillitis/pharyngitis and halitosis.  According to the patient, she has been experiencing chronic throat discomfort with halitosis for several years. The patient continues to be symptomatic despite medical treatments. On examination, the patient was noted to have bilateral cryptic tonsils, with numerous tonsilloliths. Based on the above findings, the decision was made for the patient to undergo the adenotonsillectomy procedure. Likelihood of success in reducing symptoms was also discussed.  The risks, benefits, alternatives, and details of the procedure were discussed with the mother.  Questions were invited and answered.  Informed consent was obtained.  DESCRIPTION:  The patient was taken to the operating room and placed supine on the operating table.  General endotracheal tube anesthesia was administered by the anesthesiologist.  The patient was positioned and prepped and draped in a standard fashion for adenotonsillectomy.  A Crowe-Davis mouth gag was inserted into the oral cavity for exposure. 2+ cryptic tonsils were noted bilaterally.  No bifidity was noted.  Indirect mirror examination of the nasopharynx revealed moderate adenoid hypertrophy. The adenoid was resected with the electric-cut adenotome device. Hemostasis was achieved with the Coblator device.  The right tonsil was then grasped with a straight Allis clamp and retracted medially.  It was resected  free from the underlying pharyngeal constrictor muscles with the Coblator device.  The same procedure was repeated on the left side without exception.  The surgical sites were copiously irrigated.  The mouth gag was removed.  The care of the patient was turned over to the anesthesiologist.  The patient was awakened from anesthesia without difficulty.  The patient was extubated and transferred to the recovery room in good condition.  OPERATIVE FINDINGS:  Adenotonsillar hypertrophy.  SPECIMEN:  Bilateral tonsils and adenoids  FOLLOWUP CARE:  The patient will be discharged home once awake and alert.  She will be placed on amoxicillin 800 mg p.o. b.i.d. for 5 days, and tylenol with hydrocodone 15ml po q 4 hours for postop pain control.   The patient will follow up in my office in approximately 2 weeks.  Jody Aguinaga,SUI W 08/27/2014 12:01 PM

## 2014-08-27 NOTE — Transfer of Care (Signed)
Immediate Anesthesia Transfer of Care Note  Patient: Heather BubaJennifer M McMillan  Procedure(s) Performed: Procedure(s) with comments: BILATERAL TONSILLECTOMY AND ADENOIDECTOMY (Bilateral) CONIZATION CERVIX  (N/A) - Laser conization and ablation of cervix CO2 LASER APPLICATION (N/A)  Patient Location: PACU  Anesthesia Type:General  Level of Consciousness: sedated, patient cooperative and lethargic  Airway & Oxygen Therapy: Patient Spontanous Breathing and Patient connected to face mask oxygen  Post-op Assessment: Report given to PACU RN and Post -op Vital signs reviewed and stable  Post vital signs: Reviewed and stable  Complications: No apparent anesthesia complications

## 2014-08-27 NOTE — Anesthesia Preprocedure Evaluation (Signed)
Anesthesia Evaluation   Patient awake and Patient confused    Reviewed: Allergy & Precautions, H&P , NPO status , Patient's Chart, lab work & pertinent test results  Airway Mallampati: I  TM Distance: >3 FB Neck ROM: Full    Dental  (+) Teeth Intact, Dental Advisory Given   Pulmonary  breath sounds clear to auscultation        Cardiovascular Rhythm:Regular Rate:Normal     Neuro/Psych    GI/Hepatic   Endo/Other    Renal/GU      Musculoskeletal   Abdominal   Peds  Hematology   Anesthesia Other Findings   Reproductive/Obstetrics                             Anesthesia Physical Anesthesia Plan  ASA: I  Anesthesia Plan: General   Post-op Pain Management:    Induction: Intravenous  Airway Management Planned: Oral ETT  Additional Equipment:   Intra-op Plan:   Post-operative Plan: Extubation in OR  Informed Consent: I have reviewed the patients History and Physical, chart, labs and discussed the procedure including the risks, benefits and alternatives for the proposed anesthesia with the patient or authorized representative who has indicated his/her understanding and acceptance.   Dental advisory given  Plan Discussed with: CRNA, Anesthesiologist and Surgeon  Anesthesia Plan Comments:         Anesthesia Quick Evaluation

## 2014-08-28 ENCOUNTER — Encounter (HOSPITAL_BASED_OUTPATIENT_CLINIC_OR_DEPARTMENT_OTHER): Payer: Self-pay | Admitting: Otolaryngology

## 2014-09-07 ENCOUNTER — Ambulatory Visit (INDEPENDENT_AMBULATORY_CARE_PROVIDER_SITE_OTHER): Payer: 59 | Admitting: Obstetrics & Gynecology

## 2014-09-07 ENCOUNTER — Encounter: Payer: Self-pay | Admitting: Obstetrics & Gynecology

## 2014-09-07 VITALS — BP 110/60 | Wt 166.0 lb

## 2014-09-07 DIAGNOSIS — Z9889 Other specified postprocedural states: Secondary | ICD-10-CM

## 2014-09-07 NOTE — Progress Notes (Signed)
  HPI: Patient returns for routine postoperative follow-up having undergone laser ablation of the cervix for HSIL on 08/27/2014. The patient's early postoperative recovery while in the hospital was notable for unremarkable. Since hospital discharge the patient reports no complaints or problems.   Current Outpatient Prescriptions  Medication Sig Dispense Refill  . citalopram (CELEXA) 10 MG tablet Take 10 mg by mouth daily.    Marland Kitchen. levonorgestrel (MIRENA) 20 MCG/24HR IUD 1 each by Intrauterine route once.    . clindamycin (CLEOCIN) 300 MG capsule Take 300 mg by mouth 4 (four) times daily.    Marland Kitchen. HYDROcodone-acetaminophen (HYCET) 7.5-325 mg/15 ml solution Take 15 mLs by mouth every 4 (four) hours as needed for moderate pain or severe pain. (Patient not taking: Reported on 09/07/2014) 450 mL 0   No current facility-administered medications for this visit.    Physical Exam: Cone bed of cervix is healing well  Diagnostic Tests: none  Impression: S/P laser ablation for HSIL of cervix  Plan: Follow up Pap 6 months

## 2014-09-12 ENCOUNTER — Encounter: Payer: Self-pay | Admitting: Obstetrics & Gynecology

## 2014-09-12 ENCOUNTER — Telehealth: Payer: Self-pay | Admitting: Obstetrics & Gynecology

## 2014-09-12 ENCOUNTER — Ambulatory Visit (INDEPENDENT_AMBULATORY_CARE_PROVIDER_SITE_OTHER): Payer: 59 | Admitting: Obstetrics & Gynecology

## 2014-09-12 ENCOUNTER — Telehealth: Payer: Self-pay | Admitting: Obstetrics and Gynecology

## 2014-09-12 VITALS — BP 100/70 | Wt 170.0 lb

## 2014-09-12 DIAGNOSIS — Z9889 Other specified postprocedural states: Secondary | ICD-10-CM

## 2014-09-12 DIAGNOSIS — D067 Carcinoma in situ of other parts of cervix: Secondary | ICD-10-CM

## 2014-09-12 NOTE — Progress Notes (Signed)
Patient ID: Heather [name], female   DOB: 1981/04/08, 33 y.o.   MRN: [id number] 2 weeks post op from ;laser ablation for HSIL  Concerned over discharge and cramps  Exam Normal no unusual discharge no bleeding Normal post ablation cone bed

## 2014-09-12 NOTE — Telephone Encounter (Signed)
Pt states that Chrystal had already called her back.

## 2014-09-12 NOTE — Telephone Encounter (Signed)
Pt states noticed a dark green pus looking discharge, no fever noted, but light cramping, surgery 08/27/2014 with Dr. Despina HiddenEure. Pt states was seen by Dr. Despina HiddenEure on 09/07/14 but she was not having the discharge at that time. Pt to come in today at 1:45 pm for evaluation.

## 2014-09-20 ENCOUNTER — Ambulatory Visit (INDEPENDENT_AMBULATORY_CARE_PROVIDER_SITE_OTHER): Payer: 59 | Admitting: Otolaryngology

## 2015-01-10 ENCOUNTER — Ambulatory Visit: Payer: 59 | Admitting: Obstetrics & Gynecology

## 2015-01-21 ENCOUNTER — Ambulatory Visit: Payer: 59 | Admitting: Obstetrics & Gynecology

## 2015-01-30 ENCOUNTER — Encounter: Payer: Self-pay | Admitting: Obstetrics & Gynecology

## 2015-01-30 ENCOUNTER — Other Ambulatory Visit (HOSPITAL_COMMUNITY)
Admission: RE | Admit: 2015-01-30 | Discharge: 2015-01-30 | Disposition: A | Discharge status: Home / Self Care | Payer: 59 | Source: Ambulatory Visit | Attending: Obstetrics & Gynecology | Admitting: Obstetrics & Gynecology

## 2015-01-30 ENCOUNTER — Ambulatory Visit (INDEPENDENT_AMBULATORY_CARE_PROVIDER_SITE_OTHER): Payer: 59 | Admitting: Obstetrics & Gynecology

## 2015-01-30 VITALS — BP 100/80 | HR 72 | Ht 65.0 in | Wt 181.0 lb

## 2015-01-30 DIAGNOSIS — Z01419 Encounter for gynecological examination (general) (routine) without abnormal findings: Secondary | ICD-10-CM | POA: Diagnosis present

## 2015-01-30 DIAGNOSIS — Z08 Encounter for follow-up examination after completed treatment for malignant neoplasm: Secondary | ICD-10-CM | POA: Diagnosis not present

## 2015-01-30 DIAGNOSIS — D069 Carcinoma in situ of cervix, unspecified: Secondary | ICD-10-CM

## 2015-01-30 MED ORDER — CITALOPRAM HYDROBROMIDE 40 MG PO TABS: 20.0000 mg | ORAL_TABLET | Freq: Every day | ORAL | Status: DC

## 2015-01-30 NOTE — Progress Notes (Signed)
Patient ID: Heather [name], female   DOB: 06-Mar-1981, 34 y.o.   MRN: [id number]  Here for first post conization Pap smear She is doing well  Son diagnosed with ALL in January and father passed away 2 weeks ago ? MI  Periods are the same  NEFG Vagina pink moist no discharge Cervix normal appearance no lesions Pap done No cmt  Follow up 6 months: Pap Refill celexa 40 1/2 tablet daily     Face to face time:  15 minutes  Greater than 50% of the visit time was spent in counseling and coordination of care with the patient.  The summary and outline of the counseling and care coordination is summarized in the note above.   All questions were answered.

## 2015-01-30 NOTE — Addendum Note (Signed)
Addended by: Criss AlvinePULLIAM, Doralyn Kirkes G on: 01/30/2015 03:14 PM   Modules accepted: Orders

## 2015-02-04 LAB — CYTOLOGY - PAP

## 2015-07-22 ENCOUNTER — Ambulatory Visit: Payer: 59 | Admitting: Cardiovascular Disease

## 2015-07-25 ENCOUNTER — Ambulatory Visit (INDEPENDENT_AMBULATORY_CARE_PROVIDER_SITE_OTHER): Payer: 59 | Admitting: Cardiovascular Disease

## 2015-07-25 ENCOUNTER — Encounter: Payer: Self-pay | Admitting: Cardiovascular Disease

## 2015-07-25 VITALS — BP 112/76 | HR 61 | Ht 65.0 in | Wt 181.0 lb

## 2015-07-25 DIAGNOSIS — R002 Palpitations: Secondary | ICD-10-CM | POA: Diagnosis not present

## 2015-07-25 DIAGNOSIS — I499 Cardiac arrhythmia, unspecified: Secondary | ICD-10-CM

## 2015-07-25 NOTE — Patient Instructions (Addendum)
Your physician wants you to follow-up in:  1 year with Dr Koneswaran You will receive a reminder letter in the mail two months in advance. If you don't receive a letter, please call our office to schedule the follow-up appointment.    Your physician recommends that you continue on your current medications as directed. Please refer to the Current Medication list given to you today.    If you need a refill on your cardiac medications before your next appointment, please call your pharmacy.     Thank you for choosing [facility] !        

## 2015-07-25 NOTE — Progress Notes (Signed)
Patient ID: Heather [name], female   DOB: 1980-10-20, 34 y.o.   MRN: [id number]      SUBJECTIVLoyal [name]: The patient presents for follow up of arrhythmia. 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 She has had a difficult year as her son was diagnosed with ALL. She experiences palpitations either once a month or once every 2 months. She did have an episode after being fatigued after work which lasted for 20 minutes. She tried bearing down and it slowed her heart rate for a short while but it promptly elevated again. She denies exertional chest pain and shortness of breath as well as leg swelling and syncope.  Fam: Father had a pacemaker at a young age.  Review of Systems: As per "subjective", otherwise negative.  Allergies  Allergen Reactions  . Epinephrine Other (See Comments)    DUE TO PALPITATIONS  . Lidocaine Other (See Comments)    TACHYCARDIA  . Penicillins Hives    AS A CHILD - STATES CAN TAKE AMOXICILLIN    Current Outpatient Prescriptions  Medication Sig Dispense Refill  . acetaminophen (TYLENOL) 500 MG tablet Take 500 mg by mouth every 6 (six) hours as needed.    . citalopram (CELEXA) 20 MG tablet Take 10 mg by mouth daily.    Marland Kitchen. levonorgestrel (MIRENA) 20 MCG/24HR IUD 1 each by Intrauterine route once.     No current facility-administered medications for this visit.    Past Medical History  Diagnosis Date  . Anxiety   . HPV (human papilloma virus) infection   . Panic attack   . Abnormal Pap smear   . IUD (intrauterine device) in place   . Palpitations   . Adenotonsillar hypertrophy     denies snoring during sleep or apnea  . Strep throat     to start antibiotic 08/20/2014    Past Surgical History  Procedure Laterality Date  . Tympanostomy tube placement Bilateral   . Tonsillectomy and adenoidectomy Bilateral 08/27/2014    Procedure: BILATERAL TONSILLECTOMY AND ADENOIDECTOMY;  Surgeon: Heather MollSui W Teoh, MD;  Location: [facility];  Service: ENT;   Laterality: Bilateral;  . Cervical conization w/bx N/A 08/27/2014    Procedure: CONIZATION CERVIX ;  Surgeon: Heather ArmsLuther H Eure, MD;  Location: [facility];  Service: Gynecology;  Laterality: N/A;  Laser conization and ablation of cervix  . Co2 laser application N/A 08/27/2014    Procedure: CO2 LASER APPLICATION;  Surgeon: Heather ArmsLuther H Eure, MD;  Location: [facility];  Service: Gynecology;  Laterality: N/A;    Social History   Social History  . Marital Status: Single    Spouse Name: N/A  . Number of Children: N/A  . Years of Education: N/A   Occupational History  . Not on file.   Social History Main Topics  . Smoking status: Never Smoker   . Smokeless tobacco: Never Used  . Alcohol Use: No  . Drug Use: No  . Sexual Activity: Yes    Birth Control/ Protection: IUD   Other Topics Concern  . Not on file   Social History Narrative     Filed Vitals:   07/25/15 1630  BP: 112/76  Pulse: 61  Height: 5\' 5"  (1.651 m)  Weight: 181 lb (82.101 kg)  SpO2: 99%    PHYSICAL EXAM General: NAD HEENT: Normal. Neck: No JVD, no thyromegaly. Lungs: Clear to auscultation bilaterally with normal respiratory effort. CV: Nondisplaced PMI.  Regular rate and rhythm, normal S1/S2,  no S3/S4, no murmur. No pretibial or periankle edema.  No carotid bruit.  Normal pedal pulses.  Abdomen: Soft, nontender, no hepatosplenomegaly, no distention.  Neurologic: Alert and oriented x 3.  Psych: Normal affect. Skin: Normal. Musculoskeletal: Normal range of motion, no gross deformities. Extremities: No clubbing or cyanosis.   ECG: Most recent ECG reviewed.      ASSESSMENT AND PLAN: 1. Arrhythmia: Palpitations are self limiting with benign symptoms overall. I have informed her should date become more severe or increase in frequency, I would then consider event monitoring. I do not feel cardiovascular testing is warranted at this time nor medical therapy. She is in agreement with  this plan.  Dispo: f/u 1 year.   Prentice Docker, M.D., F.A.C.C.

## 2015-08-02 ENCOUNTER — Other Ambulatory Visit (HOSPITAL_COMMUNITY)
Admission: RE | Admit: 2015-08-02 | Discharge: 2015-08-02 | Disposition: A | Discharge status: Home / Self Care | Payer: 59 | Source: Ambulatory Visit | Attending: Obstetrics & Gynecology | Admitting: Obstetrics & Gynecology

## 2015-08-02 ENCOUNTER — Encounter: Payer: Self-pay | Admitting: Obstetrics & Gynecology

## 2015-08-02 ENCOUNTER — Ambulatory Visit (INDEPENDENT_AMBULATORY_CARE_PROVIDER_SITE_OTHER): Payer: 59 | Admitting: Obstetrics & Gynecology

## 2015-08-02 VITALS — BP 90/60 | HR 60 | Ht 66.0 in | Wt 182.0 lb

## 2015-08-02 DIAGNOSIS — R87619 Unspecified abnormal cytological findings in specimens from cervix uteri: Secondary | ICD-10-CM | POA: Diagnosis not present

## 2015-08-02 DIAGNOSIS — Z124 Encounter for screening for malignant neoplasm of cervix: Secondary | ICD-10-CM | POA: Diagnosis not present

## 2015-08-02 DIAGNOSIS — D069 Carcinoma in situ of cervix, unspecified: Secondary | ICD-10-CM

## 2015-08-02 DIAGNOSIS — Z01419 Encounter for gynecological examination (general) (routine) without abnormal findings: Secondary | ICD-10-CM | POA: Diagnosis present

## 2015-08-02 DIAGNOSIS — Z1272 Encounter for screening for malignant neoplasm of vagina: Secondary | ICD-10-CM

## 2015-08-02 NOTE — Progress Notes (Signed)
Patient ID: Heather BubaJennifer M [name], female   DOB: 05/27/1981, 34 y.o.   MRN: [id number] Chief Complaint  Patient presents with  . 6 month pap    Blood pressure 90/60, pulse 60, height 5\' 6"  (1.676 m), weight 182 lb (82.555 kg).  33 y.o. G3P2003 No LMP recorded. Patient is not currently having periods (Reason: IUD). The current method of family planning is Mirena IUD.  Subjective Post op 08/28/2015 from laser conization secondary to CIS First follow up Pap 01/2015 was normal  Objective Vulva:  normal appearing vulva with no masses, tenderness or lesions Vagina:  normal mucosa, no discharge, no lesions or discharge noted, no mucosa, no discharge, no  lesions Cervix:  no bleeding following Pap, no cervical motion tenderness and no lesions Uterus:  not examined Adnexa: ovaries:present,      Pertinent ROS No burning with urination, frequency or urgency No nausea, vomiting or diarrhea Nor fever chills or other constitutional symptoms   Labs or studies     Impression Diagnoses this Encounter::   ICD-9-CM ICD-10-CM   1. 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 Follow-up vaginal Pap smear following surgery V76.47 Z12.4     Established relevant diagnosis(es): CIS s/p laser cone 12/15  Plan/Recommendations: No orders of the defined types were placed in this encounter.    Labs or Scans Ordered: No orders of the defined types were placed in this encounter.    Follow up Pap in 6 months  Follow up       All questions were answered.

## 2015-08-06 LAB — CYTOLOGY - PAP

## 2015-09-25 DIAGNOSIS — R0789 Other chest pain: Secondary | ICD-10-CM | POA: Diagnosis not present

## 2015-09-25 DIAGNOSIS — E559 Vitamin D deficiency, unspecified: Secondary | ICD-10-CM | POA: Diagnosis not present

## 2015-09-25 DIAGNOSIS — R945 Abnormal results of liver function studies: Secondary | ICD-10-CM | POA: Diagnosis not present

## 2015-09-25 DIAGNOSIS — E039 Hypothyroidism, unspecified: Secondary | ICD-10-CM | POA: Diagnosis not present

## 2015-11-06 DIAGNOSIS — E559 Vitamin D deficiency, unspecified: Secondary | ICD-10-CM | POA: Diagnosis not present

## 2015-11-06 DIAGNOSIS — E785 Hyperlipidemia, unspecified: Secondary | ICD-10-CM | POA: Diagnosis not present

## 2015-11-06 DIAGNOSIS — E669 Obesity, unspecified: Secondary | ICD-10-CM | POA: Diagnosis not present

## 2015-11-06 DIAGNOSIS — E039 Hypothyroidism, unspecified: Secondary | ICD-10-CM | POA: Diagnosis not present

## 2015-11-06 DIAGNOSIS — R945 Abnormal results of liver function studies: Secondary | ICD-10-CM | POA: Diagnosis not present

## 2015-11-12 ENCOUNTER — Ambulatory Visit: Payer: 59 | Admitting: Advanced Practice Midwife

## 2015-11-12 ENCOUNTER — Encounter (INDEPENDENT_AMBULATORY_CARE_PROVIDER_SITE_OTHER): Payer: Self-pay | Admitting: *Deleted

## 2015-11-20 ENCOUNTER — Encounter: Payer: Self-pay | Admitting: Advanced Practice Midwife

## 2015-11-20 ENCOUNTER — Ambulatory Visit (INDEPENDENT_AMBULATORY_CARE_PROVIDER_SITE_OTHER): Payer: 59 | Admitting: Advanced Practice Midwife

## 2015-11-20 VITALS — BP 98/80 | HR 62 | Ht 66.0 in | Wt 179.5 lb

## 2015-11-20 DIAGNOSIS — Z30433 Encounter for removal and reinsertion of intrauterine contraceptive device: Secondary | ICD-10-CM | POA: Diagnosis not present

## 2015-11-20 DIAGNOSIS — Z3202 Encounter for pregnancy test, result negative: Secondary | ICD-10-CM

## 2015-11-20 LAB — POCT URINE PREGNANCY: PREG TEST UR: NEGATIVE

## 2015-11-20 NOTE — Progress Notes (Signed)
Heather [name] is a 35 y.o. year old Caucasian female   who presents for removal and replacement of her IUD. The new IUD is Mirena.. It has been 5 years since her previous IUD placement.  The risks and benefits of the method and placement have been thouroughly reviewed with the patient and all questions were answered.  Specifically the patient is aware of failure rate of 09/998, expulsion of the IUD and of possible perforation.  The patient is aware of irregular bleeding due to the method and understands the incidence of irregular bleeding diminishes with time.  Time out was performed.  A Graves speculum was placed.  The cervix was prepped using Betadine. The strings were found to be barely visible.   They were grasped and the Mirena was easily removed. The cervix was then grasped with a tenaculum and the uterus was sounded to 7 cm. The IUD was inserted to 7 cm.  It was pulled back 1 cm and the IUD was disengaged. After 15 seconds, the IUD was placed in the fundus.  The strings were trimmed to 3 cm.  Sonogram was performed and the proper placement of the IUD was verified.  The patient was instructed on signs and symptoms of infection and to check for the strings after each menses or each month.  The patient is to refrain from intercourse for 3 days.

## 2015-11-20 NOTE — Assessment & Plan Note (Signed)
11/20/2015  

## 2015-11-21 ENCOUNTER — Telehealth: Payer: Self-pay | Admitting: *Deleted

## 2015-11-21 NOTE — Telephone Encounter (Signed)
Pt informed of Cathie Beams, CNM recommendation, pt verbalized understanding.

## 2015-11-21 NOTE — Telephone Encounter (Signed)
Pt states IUD removed/reinserted yesterday, had some clots, light to moderate vaginal bleeding, cramping lower abdomen and back.

## 2015-11-21 NOTE — Telephone Encounter (Signed)
All is normal--she can take Motrin  every 6 hours (by the clock) for a few days if she needs to

## 2015-12-09 ENCOUNTER — Encounter (INDEPENDENT_AMBULATORY_CARE_PROVIDER_SITE_OTHER): Payer: Self-pay

## 2015-12-09 ENCOUNTER — Encounter (INDEPENDENT_AMBULATORY_CARE_PROVIDER_SITE_OTHER): Payer: Self-pay | Admitting: Internal Medicine

## 2015-12-09 ENCOUNTER — Ambulatory Visit (INDEPENDENT_AMBULATORY_CARE_PROVIDER_SITE_OTHER): Payer: 59 | Admitting: Internal Medicine

## 2015-12-09 VITALS — BP 110/80 | HR 72 | Temp 98.2°F | Ht 66.0 in | Wt 181.5 lb

## 2015-12-09 DIAGNOSIS — R748 Abnormal levels of other serum enzymes: Secondary | ICD-10-CM

## 2015-12-09 NOTE — Patient Instructions (Signed)
Labs today. OV in 6 weeks. 

## 2015-12-09 NOTE — Progress Notes (Signed)
Subjective:    Patient ID: Heather [name], female    DOB: 05-16-1981, 35 y.o.   MRN: [id number]  HPI   PCP: Dr. Raquel JamesGasrani. Referred by Dr. Karilyn CotaGosrani for elevated liver enzymes. No prior hx of prior liver enzymes.  Appetite is good. She is tired. She has 3 children at home.  No abdominal pain.  No prior hx of being jaundice. She received Hepatitis B booster at [facility] in 2015 or 2016.  She rarely takes Tylenol or Motrin.  11/06/2015 Hepatitis B Surface antigen negative, Hepatitis B Surface Antibody Qual. Positive. Hepatitis A antibody non reactive.  Hepatitis C antibody negative.  10/28/2015 ALP 113, AST 64, ALT 151 09/26/2015 ALP 96, AST 35, ALT 113 07/23/2015 total bili 0.7, AST 29, ALP 88, ALT 60.  H and H 13.5 and 40.3, platelet ct 189 06/24/2015 3.044 07/25/2015 US RT upper Quadrant: abnormal liver enzymes. The lives appears normal per US. No evidence of cholelithiasis or evidence of biliary tract obstruction. GB is normal. CBD is 0.26cm. No evidence of biliary tract obstruction. No evidence of a liver tumor or mass.  She tells me she has a son with leukemia. She does give her son his chemo medication (6-MP), Methotrexate, and SMZ. She does not touch these medication, she crushes them for him.  CBC    Component Value Date/Time   WBC 6.3 08/27/2014 0815   RBC 4.91 08/27/2014 0815   HGB 14.4 08/27/2014 0815   HCT 42.2 08/27/2014 0815   PLT 163 08/27/2014 0815   MCV 85.9 08/27/2014 0815   MCH 29.3 08/27/2014 0815   MCHC 34.1 08/27/2014 0815   RDW 11.8 08/27/2014 0815       Review of Systems Past Medical History  Diagnosis Date  . Anxiety   . HPV (human papilloma virus) infection   . Panic attack   . Abnormal Pap smear   . IUD (intrauterine device) in place   . Palpitations   . Adenotonsillar hypertrophy     denies snoring during sleep or apnea  . Strep throat     to start antibiotic 08/20/2014    Past Surgical History  Procedure Laterality Date  . Tympanostomy  tube placement Bilateral   . Tonsillectomy and adenoidectomy Bilateral 08/27/2014    Procedure: BILATERAL TONSILLECTOMY AND ADENOIDECTOMY;  Surgeon: Darletta MollSui W Teoh, MD;  Location: [facility];  Service: ENT;  Laterality: Bilateral;  . Cervical conization w/bx N/A 08/27/2014    Procedure: CONIZATION CERVIX ;  Surgeon: Lazaro ArmsLuther H Eure, MD;  Location: [facility];  Service: Gynecology;  Laterality: N/A;  Laser conization and ablation of cervix  . Co2 laser application N/A 08/27/2014    Procedure: CO2 LASER APPLICATION;  Surgeon: Lazaro ArmsLuther H Eure, MD;  Location: [facility];  Service: Gynecology;  Laterality: N/A;    Allergies  Allergen Reactions  . Epinephrine Other (See Comments)    DUE TO PALPITATIONS  . Lidocaine Other (See Comments)    TACHYCARDIA  . Penicillins Hives    AS A CHILD - STATES CAN TAKE AMOXICILLIN    Current Outpatient Prescriptions on File Prior to Visit  Medication Sig Dispense Refill  . citalopram (CELEXA) 20 MG tablet Take 10 mg by mouth daily.    Marland Kitchen. levonorgestrel (MIRENA) 20 MCG/24HR IUD 1 each by Intrauterine route once.     No current facility-administered medications on file prior to visit.        Objective:   Physical Exam Blood pressure 110/80, pulse  72, temperature 98.2 F (36.8 C), height  (1.676 m), weight 181 lb 8 oz (82.328 kg). Alert and oriented. Skin warm and dry. Oral mucosa is moist.   . Sclera anicteric, conjunctivae is pink. Thyroid not enlarged. No cervical lymphadenopathy. Lungs clear. Heart regular rate and rhythm.  Abdomen is soft. Bowel sounds are positive. No hepatomegaly. No abdominal masses felt. No tenderness.  No edema to lower extremities.          Assessment & Plan:  Elevated liver enzymes. Hep A, B, C negative. Liver normal.  Will repeat a CBC, Hepatic function.   Will obtain ANA, SMA, AFP OV 6 weeks.

## 2015-12-10 ENCOUNTER — Ambulatory Visit (INDEPENDENT_AMBULATORY_CARE_PROVIDER_SITE_OTHER): Payer: 59 | Admitting: Adult Health

## 2015-12-10 ENCOUNTER — Encounter: Payer: Self-pay | Admitting: Adult Health

## 2015-12-10 ENCOUNTER — Encounter: Payer: Self-pay | Admitting: *Deleted

## 2015-12-10 VITALS — BP 118/72 | HR 71 | Ht 66.0 in | Wt 180.0 lb

## 2015-12-10 DIAGNOSIS — R0789 Other chest pain: Secondary | ICD-10-CM

## 2015-12-10 DIAGNOSIS — R1011 Right upper quadrant pain: Secondary | ICD-10-CM | POA: Diagnosis not present

## 2015-12-10 DIAGNOSIS — R002 Palpitations: Secondary | ICD-10-CM | POA: Diagnosis not present

## 2015-12-10 LAB — CBC WITH DIFFERENTIAL/PLATELET
BASOS PCT: 1 % (ref 0–1)
Basophils Absolute: 0.1 10*3/uL (ref 0.0–0.1)
Eosinophils Absolute: 0.1 10*3/uL (ref 0.0–0.7)
Eosinophils Relative: 2 % (ref 0–5)
HCT: 42.4 % (ref 36.0–46.0)
HEMOGLOBIN: 14.2 g/dL (ref 12.0–15.0)
LYMPHS ABS: 2 10*3/uL (ref 0.7–4.0)
Lymphocytes Relative: 37 % (ref 12–46)
MCH: 29.5 pg (ref 26.0–34.0)
MCHC: 33.5 g/dL (ref 30.0–36.0)
MCV: 88.1 fL (ref 78.0–100.0)
MPV: 11.3 fL (ref 8.6–12.4)
Monocytes Absolute: 0.5 10*3/uL (ref 0.1–1.0)
Monocytes Relative: 9 % (ref 3–12)
NEUTROS ABS: 2.8 10*3/uL (ref 1.7–7.7)
NEUTROS PCT: 51 % (ref 43–77)
Platelets: 213 10*3/uL (ref 150–400)
RBC: 4.81 MIL/uL (ref 3.87–5.11)
RDW: 12.8 % (ref 11.5–15.5)
WBC: 5.4 10*3/uL (ref 4.0–10.5)

## 2015-12-10 LAB — COMPREHENSIVE METABOLIC PANEL
ALT: 126 U/L — ABNORMAL HIGH (ref 6–29)
AST: 41 U/L — ABNORMAL HIGH (ref 10–30)
Albumin: 4.7 g/dL (ref 3.6–5.1)
Alkaline Phosphatase: 116 U/L — ABNORMAL HIGH (ref 33–115)
BUN: 12 mg/dL (ref 7–25)
CHLORIDE: 101 mmol/L (ref 98–110)
CO2: 29 mmol/L (ref 20–31)
Calcium: 9.8 mg/dL (ref 8.6–10.2)
Creat: 0.72 mg/dL (ref 0.50–1.10)
GLUCOSE: 86 mg/dL (ref 65–99)
POTASSIUM: 4.1 mmol/L (ref 3.5–5.3)
Sodium: 141 mmol/L (ref 135–146)
Total Bilirubin: 0.8 mg/dL (ref 0.2–1.2)
Total Protein: 7.2 g/dL (ref 6.1–8.1)

## 2015-12-10 LAB — AFP TUMOR MARKER: AFP-Tumor Marker: 2.1 ng/mL (ref <6.1)

## 2015-12-10 LAB — ANTI-SMOOTH MUSCLE ANTIBODY, IGG: Smooth Muscle Ab: 59 U — ABNORMAL HIGH (ref <20)

## 2015-12-10 LAB — ANA: ANA: NEGATIVE

## 2015-12-10 NOTE — Progress Notes (Deleted)
Name: Heather [name]    DOB: March 16, 1981  Age: 35 y.o.  MR#: [id number]       PCP:  Doree Albee, MD      Insurance: Payor: [employer] / Plan:  [employer] / Product Type: *No Product type* /   CC:   No chief complaint on file.   VS Filed Vitals:   12/10/15 1350  BP: 118/72  Pulse: 71  Height: '5\' 6"'$  (1.676 m)  Weight: 180 lb (81.647 kg)  SpO2: 99%    Weights Current Weight  12/10/15 180 lb (81.647 kg)  12/09/15 181 lb 8 oz (82.328 kg)  11/20/15 179 lb 8 oz (81.421 kg)    Blood Pressure  BP Readings from Last 3 Encounters:  12/10/15 118/72  12/09/15 110/80  11/20/15 98/80     Admit date:  (Not on file) Last encounter with RMR:  Visit date not found   Allergy Epinephrine; Lidocaine; and Penicillins  Current Outpatient Prescriptions  Medication Sig Dispense Refill  . citalopram (CELEXA) 20 MG tablet Take 10 mg by mouth daily.    Marland Kitchen levonorgestrel (MIRENA) 20 MCG/24HR IUD 1 each by Intrauterine route once.     No current facility-administered medications for this visit.    Discontinued Meds:   There are no discontinued medications.  Patient Active Problem List   Diagnosis Date Noted  . Arrhythmia 06/11/2014  . 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 Strain of hip 01/22/2014  . Anxiety 01/25/2013  . History of abnormal Pap smear 01/25/2013  . IUD (intrauterine device) in place 01/25/2013    LABS    Component Value Date/Time   NA 141 12/09/2015 1449   NA 139 05/12/2010 2320   K 4.1 12/09/2015 1449   K 3.4* 05/12/2010 2320   CL 101 12/09/2015 1449   CL 108 05/12/2010 2320   CO2 29 12/09/2015 1449   CO2 26 05/12/2010 2320   GLUCOSE 86 12/09/2015 1449   GLUCOSE 87 05/12/2010 2320   BUN 12 12/09/2015 1449   BUN 6 05/12/2010 2320   CREATININE 0.72 12/09/2015 1449   CREATININE 0.57 05/12/2010 2320   CALCIUM 9.8 12/09/2015 1449   CALCIUM 9.0 05/12/2010 2320   GFRNONAA >60 05/12/2010 2320   GFRAA  05/12/2010 2320    >60         The eGFR has been calculated using the MDRD equation. This calculation has not been validated in all clinical situations. eGFR's persistently <60 mL/min signify possible Chronic Kidney Disease.   CMP     Component Value Date/Time   NA 141 12/09/2015 1449   K 4.1 12/09/2015 1449   CL 101 12/09/2015 1449   CO2 29 12/09/2015 1449   GLUCOSE 86 12/09/2015 1449   BUN 12 12/09/2015 1449   CREATININE 0.72 12/09/2015 1449   CREATININE 0.57 05/12/2010 2320   CALCIUM 9.8 12/09/2015 1449   PROT 7.2 12/09/2015 1449   ALBUMIN 4.7 12/09/2015 1449   AST 41* 12/09/2015 1449   ALT 126* 12/09/2015 1449   ALKPHOS 116* 12/09/2015 1449   BILITOT 0.8 12/09/2015 1449   GFRNONAA >60 05/12/2010 2320   GFRAA  05/12/2010 2320    >60        The eGFR has been calculated using the MDRD equation. This calculation has not been validated in all clinical situations. eGFR's persistently <60 mL/min signify possible Chronic Kidney Disease.       Component Value Date/Time   WBC 5.4 12/09/2015 1449   WBC  6.3 08/27/2014 0815   WBC 6.8 11/06/2010 0655   HGB 14.2 12/09/2015 1449   HGB 14.4 08/27/2014 0815   HGB 10.9* 11/06/2010 0655   HCT 42.4 12/09/2015 1449   HCT 42.2 08/27/2014 0815   HCT 34.9* 11/06/2010 0655   MCV 88.1 12/09/2015 1449   MCV 85.9 08/27/2014 0815   MCV 82.3 11/06/2010 0655    Lipid Panel  No results found for: CHOL, TRIG, HDL, CHOLHDL, VLDL, LDLCALC, LDLDIRECT  ABG No results found for: PHART, PCO2ART, PO2ART, HCO3, TCO2, ACIDBASEDEF, O2SAT   No results found for: TSH BNP (last 3 results) No results for input(s): BNP in the last 8760 hours.  ProBNP (last 3 results) No results for input(s): PROBNP in the last 8760 hours.  Cardiac Panel (last 3 results) No results for input(s): CKTOTAL, CKMB, TROPONINI, RELINDX in the last 72 hours.  Iron/TIBC/Ferritin/ %Sat No results found for: IRON, TIBC, FERRITIN, IRONPCTSAT   EKG Orders placed or performed in visit on 12/10/15   . EKG 12-Lead     Prior Assessment and Plan Problem List as of 12/10/2015      Cardiovascular and Mediastinum   Arrhythmia     Musculoskeletal and Integument   Strain of hip     Genitourinary   Carcinoma in situ of cervix uteri     Other   Anxiety   History of abnormal Pap smear   IUD (intrauterine device) in place   Last Assessment & Plan 11/20/2015 Procedure visit Written 11/20/2015  1:31 PM by Christin Fudge, CNM    11/20/2015           Imaging: No results found.

## 2015-12-10 NOTE — Progress Notes (Signed)
Cardiology Office Note   Date:  12/10/2015   ID:  Heather [name], DOB 06/01/1981, MRN [id number]  PCP:  Wilson Singer, MD  Cardiologist: Inis Sizer, NP   Chief Complaint  Patient presents with  . Chest Pain  . Palpitations      History of Present Illness: Heather [name] is a 35 y.o. female who presents for continued complaints of chest pressure.  She states that she is feeling more tired, feeling palpitations, along with nausea.  She states that it began last night after work when she finished she began to feel very nauseated.  She had eaten pizza prior to that.  She has had some lab work drawn on 12/09/2015.  Sodium 141, potassium 4.1, chloride 101, CO2 29, BUN 12, creatinine 0.7 to elevated liver enzymes, alkaline phosphatase 116, AST 41, ALT 126.she has been having some sharp pains as well  Past Medical History  Diagnosis Date  . Anxiety   . HPV (human papilloma virus) infection   . Panic attack   . Abnormal Pap smear   . IUD (intrauterine device) in place   . Palpitations   . Adenotonsillar hypertrophy     denies snoring during sleep or apnea  . Strep throat     to start antibiotic 08/20/2014    Past Surgical History  Procedure Laterality Date  . Tympanostomy tube placement Bilateral   . Tonsillectomy and adenoidectomy Bilateral 08/27/2014    Procedure: BILATERAL TONSILLECTOMY AND ADENOIDECTOMY;  Surgeon: Darletta Moll, MD;  Location: [facility];  Service: ENT;  Laterality: Bilateral;  . Cervical conization w/bx N/A 08/27/2014    Procedure: CONIZATION CERVIX ;  Surgeon: Lazaro Arms, MD;  Location: [facility];  Service: Gynecology;  Laterality: N/A;  Laser conization and ablation of cervix  . Co2 laser application N/A 08/27/2014    Procedure: CO2 LASER APPLICATION;  Surgeon: Lazaro Arms, MD;  Location: [facility];  Service: Gynecology;  Laterality: N/A;     Current Outpatient Prescriptions   Medication Sig Dispense Refill  . citalopram (CELEXA) 20 MG tablet Take 10 mg by mouth daily.    Marland Kitchen levonorgestrel (MIRENA) 20 MCG/24HR IUD 1 each by Intrauterine route once.     No current facility-administered medications for this visit.    Allergies:   Epinephrine; Lidocaine; and Penicillins    Social History:  The patient  reports that she has never smoked. She has never used smokeless tobacco. She reports that she does not drink alcohol or use illicit drugs.   Family History:  The patient's family history includes Asthma in her son; Cancer in her paternal grandfather; Heart disease in her father and maternal grandmother; Hypertension in her father and mother; Leukemia in her son; Macular degeneration in her paternal grandmother; Stroke in her paternal grandfather.    ROS: All other systems are reviewed and negative. Unless otherwise mentioned in H&P    PHYSICAL EXAM: VS:  BP 118/72 mmHg  Pulse 71  Ht  (1.676 m)  Wt 180 lb (81.647 kg)  BMI 29.07 kg/m2  SpO2 99% , BMI Body mass index is 29.07 kg/(m^2). GEN: Well nourished, well developed, in no acute distress HEENT: normal Neck: no JVD, carotid bruits, or masses Cardiac: RRR; no murmurs, rubs, or gallops,no edema  Respiratory:  clear to auscultation bilaterally, normal work of breathing GI: soft, tenderness in the right upper quadrant, positive Murphy sign., nondistended, + BS MS: no deformity or atrophy Skin: warm  and dry, no rash Neuro:  Strength and sensation are intact Psych: euthymic mood, full affect   EKG:   The ekg ordered today demonstrates normal sinus rhythm.   Recent Labs: 12/09/2015: ALT 126*; BUN 12; Creat 0.72; Hemoglobin 14.2; Platelets 213; Potassium 4.1; Sodium 141    Lipid Panel No results found for: CHOL, TRIG, HDL, CHOLHDL, VLDL, LDLCALC, LDLDIRECT    Wt Readings from Last 3 Encounters:  12/10/15 180 lb (81.647 kg)  12/09/15 181 lb 8 oz (82.328 kg)  11/20/15 179 lb 8 oz (81.421 kg)      ASSESSMENT AND PLAN:  1. Chest pressure:more epigastric in etiology, with positive Murphy's sign, low-grade nausea and gas pain after deep palpation.  Will order a HIDA scan, if  abdominal ultrasound is non-diagnostic. Liver enzymes are mildly elevated.  She may need to have a further workup once testing is completed.  Will defer to primary care physician.  She has been advised to take over-the-counter Gas-X.  She is given the next 2 days off to undergo testing and will receive a letter to do so.  2. Palpitations:this may be anxiety induced.  Her father recently died and she has some family stresses at home with children.  She has not had an echocardiogram.  I will have this ordered for some time in the next month.   Current medicines are reviewed at length with the patient today.    Labs/ tests ordered today include: HIDA scan, abdominal ultrasound, and echocardiogram.  Orders Placed This Encounter  Procedures  . EKG 12-Lead     Disposition:   FU with one year  Signed, Joni ReiningKathryn Tanya Crothers, NP  12/10/2015 2:23 PM    [facility] [address], [place], [place] [address] Phone: [phone]; Fax: [phone]

## 2015-12-10 NOTE — Patient Instructions (Signed)
Your physician recommends that you schedule a follow-up appointment As Needed  Your physician recommends that you continue on your current medications as directed. Please refer to the Current Medication list given to you today.  Your physician has requested that you have an echocardiogram. Echocardiography is a painless test that uses sound waves to create images of your heart. It provides your doctor with information about the size and shape of your heart and how well your heart's chambers and valves are working. This procedure takes approximately one hour. There are no restrictions for this procedure.  Start Gas-X   You have been given a letter for work   If you need a refill on your cardiac medications before your next appointment, please call your pharmacy.  Thank you for choosing [facility]!

## 2015-12-12 ENCOUNTER — Telehealth (INDEPENDENT_AMBULATORY_CARE_PROVIDER_SITE_OTHER): Payer: Self-pay | Admitting: Internal Medicine

## 2015-12-12 ENCOUNTER — Other Ambulatory Visit (HOSPITAL_COMMUNITY): Payer: Self-pay | Admitting: Internal Medicine

## 2015-12-12 ENCOUNTER — Ambulatory Visit (HOSPITAL_COMMUNITY)
Admission: RE | Admit: 2015-12-12 | Discharge: 2015-12-12 | Disposition: A | Discharge status: Home / Self Care | Payer: 59 | Source: Ambulatory Visit | Attending: Adult Health | Admitting: Adult Health

## 2015-12-12 DIAGNOSIS — R1011 Right upper quadrant pain: Secondary | ICD-10-CM | POA: Insufficient documentation

## 2015-12-12 DIAGNOSIS — R748 Abnormal levels of other serum enzymes: Secondary | ICD-10-CM

## 2015-12-12 DIAGNOSIS — R002 Palpitations: Secondary | ICD-10-CM | POA: Diagnosis not present

## 2015-12-12 LAB — HEPATIC FUNCTION PANEL
ALBUMIN: 4.2 g/dL (ref 3.6–5.1)
ALT: 82 U/L — ABNORMAL HIGH (ref 6–29)
AST: 25 U/L (ref 10–30)
Alkaline Phosphatase: 97 U/L (ref 33–115)
Bilirubin, Direct: 0.2 mg/dL (ref <=0.2)
Indirect Bilirubin: 1 mg/dL (ref 0.2–1.2)
TOTAL PROTEIN: 6.9 g/dL (ref 6.1–8.1)
Total Bilirubin: 1.2 mg/dL (ref 0.2–1.2)

## 2015-12-12 LAB — SEDIMENTATION RATE: SED RATE: 7 mm/h (ref 0–20)

## 2015-12-12 NOTE — Telephone Encounter (Signed)
Igg, Sedrate, Hepatic function ordered.

## 2015-12-13 LAB — IGG: IGG (IMMUNOGLOBIN G), SERUM: 1100 mg/dL (ref 690–1700)

## 2015-12-17 ENCOUNTER — Telehealth (INDEPENDENT_AMBULATORY_CARE_PROVIDER_SITE_OTHER): Payer: Self-pay | Admitting: Internal Medicine

## 2015-12-17 NOTE — Telephone Encounter (Signed)
Ms. Heather [name] left a message on the voice mail asking for a return call with the results of her most recent labs. Please give her a call when possible.  Pt's ph# [phone]  Thank you.

## 2015-12-17 NOTE — Telephone Encounter (Signed)
Results given to patient. Scheduled for a HIDA scan tomorrow.

## 2015-12-18 ENCOUNTER — Other Ambulatory Visit (HOSPITAL_COMMUNITY): Payer: 59

## 2015-12-19 ENCOUNTER — Encounter (HOSPITAL_COMMUNITY)
Admission: RE | Admit: 2015-12-19 | Discharge: 2015-12-19 | Disposition: A | Discharge status: Home / Self Care | Payer: 59 | Source: Ambulatory Visit | Attending: Internal Medicine | Admitting: Internal Medicine

## 2015-12-19 ENCOUNTER — Encounter (HOSPITAL_COMMUNITY): Payer: Self-pay

## 2015-12-19 ENCOUNTER — Other Ambulatory Visit (HOSPITAL_COMMUNITY): Payer: Self-pay | Admitting: Internal Medicine

## 2015-12-19 DIAGNOSIS — R1013 Epigastric pain: Secondary | ICD-10-CM | POA: Diagnosis not present

## 2015-12-19 DIAGNOSIS — R7989 Other specified abnormal findings of blood chemistry: Secondary | ICD-10-CM | POA: Insufficient documentation

## 2015-12-19 DIAGNOSIS — R748 Abnormal levels of other serum enzymes: Secondary | ICD-10-CM

## 2015-12-19 MED ORDER — TECHNETIUM TC 99M MEBROFENIN IV KIT
5.0000 | PACK | Freq: Once | INTRAVENOUS | Status: AC | PRN
  Administered 2015-12-19: 5 via INTRAVENOUS

## 2015-12-24 ENCOUNTER — Ambulatory Visit: Payer: 59 | Admitting: Advanced Practice Midwife

## 2015-12-26 ENCOUNTER — Telehealth (INDEPENDENT_AMBULATORY_CARE_PROVIDER_SITE_OTHER): Payer: Self-pay | Admitting: Internal Medicine

## 2015-12-26 ENCOUNTER — Telehealth (INDEPENDENT_AMBULATORY_CARE_PROVIDER_SITE_OTHER): Payer: Self-pay | Admitting: *Deleted

## 2015-12-26 DIAGNOSIS — K219 Gastro-esophageal reflux disease without esophagitis: Secondary | ICD-10-CM

## 2015-12-26 DIAGNOSIS — R748 Abnormal levels of other serum enzymes: Secondary | ICD-10-CM

## 2015-12-26 MED ORDER — PANTOPRAZOLE SODIUM 40 MG PO TBEC: 40.0000 mg | DELAYED_RELEASE_TABLET | Freq: Every day | ORAL | Status: DC

## 2015-12-26 NOTE — Telephone Encounter (Signed)
Rx sent to her pharmacy for Protnix

## 2015-12-26 NOTE — Telephone Encounter (Signed)
.  Per Delrae Renderri Setzer,NP patient to have fasting lab in 4 weeks.

## 2015-12-26 NOTE — Telephone Encounter (Signed)
Heather [name], fasting hepatic function in 4 weeks

## 2015-12-26 NOTE — Telephone Encounter (Signed)
Patient lab is noted for 4 weeks.

## 2016-01-02 ENCOUNTER — Encounter: Payer: Self-pay | Admitting: Adult Health

## 2016-01-02 ENCOUNTER — Ambulatory Visit (INDEPENDENT_AMBULATORY_CARE_PROVIDER_SITE_OTHER): Payer: 59 | Admitting: Adult Health

## 2016-01-02 VITALS — BP 110/64 | HR 64 | Ht 66.0 in | Wt 179.5 lb

## 2016-01-02 DIAGNOSIS — R1011 Right upper quadrant pain: Secondary | ICD-10-CM

## 2016-01-02 DIAGNOSIS — Z975 Presence of (intrauterine) contraceptive device: Secondary | ICD-10-CM

## 2016-01-02 DIAGNOSIS — F419 Anxiety disorder, unspecified: Secondary | ICD-10-CM | POA: Diagnosis not present

## 2016-01-02 HISTORY — DX: Right upper quadrant pain: R10.11

## 2016-01-02 MED ORDER — CITALOPRAM HYDROBROMIDE 20 MG PO TABS: ORAL_TABLET | ORAL | Status: DC

## 2016-01-02 NOTE — Patient Instructions (Signed)
Follow up prn

## 2016-01-02 NOTE — Progress Notes (Signed)
Subjective:     Patient ID: Heather [name], female   DOB: 1980-10-13, 35 y.o.   MRN: [id number]  HPI Heather [name] is a 35 year old white female in for IUD check, it was placed 11/20/15 and has had RUQ pain and Gallbladder working at 59% (would not remove til function less than 50%) and has chest pain at times and MD wanted her to have IUD checked to make sure it was in place.She requests refill on celexa for anxiety, son has leukemia and is doing well but has bad days, he is 5.   Review of Systems Patient denies any headaches, hearing loss, fatigue, blurred vision, shortness of breath,  problems with bowel movements, urination, or intercourse. No joint pain or mood swings. See HPI for positives. Reviewed past medical,surgical, social and family history. Reviewed medications and allergies.     Objective:   Physical Exam BP 110/64 mmHg  Pulse 64  Ht 5\' 6"  (1.676 m)  Wt 179 lb 8 oz (81.421 kg)  BMI 28.99 kg/m2 Skin warm and dry.Pelvic: external genitalia is normal in appearance no lesions, vagina:scant discharge without odor,urethra has no lesions or masses noted, cervix:smooth and bulbous,+IUD strings, and IUD in place on US, uterus: normal size, shape and contour, non tender, no masses felt, adnexa: no masses or tenderness noted. Bladder is non tender and no masses felt. Abdomen is soft and tender RUQ, no masses felt.    Assessment:    RUQ pain IUD in place Anxiety     Plan:    See surgeon in follow up  for Gallbladder Refilled celexa 20 mg take 1/2 tablet daily #15 with 11 refills Follow up prn

## 2016-01-15 ENCOUNTER — Encounter (INDEPENDENT_AMBULATORY_CARE_PROVIDER_SITE_OTHER): Payer: Self-pay | Admitting: *Deleted

## 2016-01-15 ENCOUNTER — Other Ambulatory Visit (INDEPENDENT_AMBULATORY_CARE_PROVIDER_SITE_OTHER): Payer: Self-pay | Admitting: *Deleted

## 2016-01-15 DIAGNOSIS — R748 Abnormal levels of other serum enzymes: Secondary | ICD-10-CM

## 2016-01-20 ENCOUNTER — Ambulatory Visit (INDEPENDENT_AMBULATORY_CARE_PROVIDER_SITE_OTHER): Payer: 59 | Admitting: Internal Medicine

## 2016-01-30 ENCOUNTER — Ambulatory Visit (INDEPENDENT_AMBULATORY_CARE_PROVIDER_SITE_OTHER): Payer: 59 | Admitting: Obstetrics & Gynecology

## 2016-01-30 ENCOUNTER — Encounter (INDEPENDENT_AMBULATORY_CARE_PROVIDER_SITE_OTHER): Payer: Self-pay | Admitting: Internal Medicine

## 2016-01-30 ENCOUNTER — Ambulatory Visit (INDEPENDENT_AMBULATORY_CARE_PROVIDER_SITE_OTHER): Payer: 59 | Admitting: Internal Medicine

## 2016-01-30 VITALS — BP 102/52 | HR 64 | Temp 98.0°F | Ht 66.0 in | Wt 178.2 lb

## 2016-01-30 DIAGNOSIS — R748 Abnormal levels of other serum enzymes: Secondary | ICD-10-CM

## 2016-01-30 NOTE — Progress Notes (Signed)
   Subjective:    Patient ID: Heather [name], female    DOB: [birth year], 35 y.o.   MRN: [id number] PCP Dr. Gearlean Alf. HPI Here today for f/u of her elevated liver enzymes.  She tells me she is doing well. Her appetite is good. She has lost 3 pounds since her last visit. ' She planning to move to [facility] in the new future. GB work up negative.  12/12/2015 IGG 1100, sedrate 7.  Hepatic Function Latest Ref Rng 12/12/2015 12/09/2015 05/12/2010  Total Protein 6.1 - 8.1 g/dL 6.9 7.2 5.8(L)  Albumin 3.6 - 5.1 g/dL 4.2 4.7 3.2(L)  AST 10 - 30 U/L 25 41(H) 28  ALT 6 - 29 U/L 82(H) 126(H) 43(H)  Alk Phosphatase 33 - 115 U/L 97 116(H) 49  Total Bilirubin 0.2 - 1.2 mg/dL 1.2 0.8 0.3  Bilirubin, Direct <=0.2 mg/dL 0.2 - -        11/06/2015 Hepatitis B Surface antigen negative, Hepatitis B Surface Antibody Qual. Positive. Hepatitis A antibody non reactive.  Hepatitis C antibody negative.  10/28/2015 ALP 113, AST 64, ALT 151 09/26/2015 ALP 96, AST 35, ALT 113 07/23/2015 total bili 0.7, AST 29, ALP 88, ALT 60.  H and H 13.5 and 40.3, platelet ct 189 06/24/2015 3.044 07/25/2015 [place] RT upper Quadrant: abnormal liver enzymes. The lives appears normal per [place]. No evidence of cholelithiasis or evidence of biliary tract obstruction. GB is normal. CBD is 0.26cm. No evidence of biliary tract obstruction. No evidence of a liver tumor or mass.       Review of Systems Alert and oriented. Skin warm and dry. Oral mucosa is moist.   . Sclera anicteric, conjunctivae is pink. Thyroid not enlarged. No cervical lymphadenopathy. Lungs clear. Heart regular rate and rhythm.  Abdomen is soft. Bowel sounds are positive. No hepatomegaly. No abdominal masses felt. No tenderness.  No edema to lower extremities.       Objective:   Physical ExamBlood pressure 102/52, pulse 64, temperature 98 F (36.7 C), height _0  (1.676 m), weight 178 lb 3.2 oz (80.831 kg).  Alert and oriented. Skin warm and dry. Oral mucosa is  moist.   . Sclera anicteric, conjunctivae is pink. Thyroid not enlarged. No cervical lymphadenopathy. Lungs clear. Heart regular rate and rhythm.  Abdomen is soft. Bowel sounds are positive. No hepatomegaly. No abdominal masses felt. No tenderness.  No edema to lower extremities.         Assessment & Plan:  Elevated liver enzymes. Enzymes have come down. ALT slightly elevated. Hepatic function today. Ov in 6 months

## 2016-01-30 NOTE — Patient Instructions (Signed)
OV in 6 months. 

## 2016-01-31 LAB — HEPATIC FUNCTION PANEL
ALT: 38 U/L — AB (ref 6–29)
AST: 19 U/L (ref 10–30)
Albumin: 4.5 g/dL (ref 3.6–5.1)
Alkaline Phosphatase: 92 U/L (ref 33–115)
BILIRUBIN DIRECT: 0.2 mg/dL (ref <=0.2)
BILIRUBIN INDIRECT: 0.6 mg/dL (ref 0.2–1.2)
TOTAL PROTEIN: 6.8 g/dL (ref 6.1–8.1)
Total Bilirubin: 0.8 mg/dL (ref 0.2–1.2)

## 2016-02-03 ENCOUNTER — Other Ambulatory Visit (HOSPITAL_COMMUNITY)
Admission: RE | Admit: 2016-02-03 | Discharge: 2016-02-03 | Disposition: A | Discharge status: Home / Self Care | Payer: 59 | Source: Ambulatory Visit | Attending: Obstetrics & Gynecology | Admitting: Obstetrics & Gynecology

## 2016-02-03 ENCOUNTER — Ambulatory Visit (INDEPENDENT_AMBULATORY_CARE_PROVIDER_SITE_OTHER): Payer: 59 | Admitting: Obstetrics & Gynecology

## 2016-02-03 ENCOUNTER — Encounter: Payer: Self-pay | Admitting: Obstetrics & Gynecology

## 2016-02-03 VITALS — BP 100/60 | HR 80 | Ht 66.0 in | Wt 179.4 lb

## 2016-02-03 DIAGNOSIS — Z01419 Encounter for gynecological examination (general) (routine) without abnormal findings: Secondary | ICD-10-CM | POA: Insufficient documentation

## 2016-02-03 DIAGNOSIS — R87613 High grade squamous intraepithelial lesion on cytologic smear of cervix (HGSIL): Secondary | ICD-10-CM | POA: Diagnosis not present

## 2016-02-03 NOTE — Progress Notes (Signed)
Patient ID: Heather [name], female   DOB: 1980-09-27, 35 y.o.   MRN: [id number] Chief Complaint  Patient presents with  . 6 month pap smear    Blood pressure 100/60, pulse 80, height 5\' 6"  (1.676 m), weight 179 lb 6.4 oz (81.375 kg).  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

## 2016-02-03 NOTE — Addendum Note (Signed)
Addended by: Federico FlakeNES, PEGGY A on: 02/03/2016 03:57 PM   Modules accepted: Orders

## 2016-02-05 LAB — CYTOLOGY - PAP

## 2016-02-12 DIAGNOSIS — J019 Acute sinusitis, unspecified: Secondary | ICD-10-CM | POA: Diagnosis not present

## 2016-05-01 ENCOUNTER — Emergency Department (HOSPITAL_COMMUNITY): Payer: 59

## 2016-05-01 ENCOUNTER — Encounter (HOSPITAL_COMMUNITY): Payer: Self-pay | Admitting: Cardiology

## 2016-05-01 ENCOUNTER — Emergency Department (HOSPITAL_COMMUNITY)
Admission: EM | Admit: 2016-05-01 | Discharge: 2016-05-01 | Disposition: A | Discharge status: Home / Self Care | Payer: 59 | Attending: Emergency Medicine | Admitting: Emergency Medicine

## 2016-05-01 DIAGNOSIS — Y939 Activity, unspecified: Secondary | ICD-10-CM | POA: Diagnosis not present

## 2016-05-01 DIAGNOSIS — Z86001 Personal history of in-situ neoplasm of cervix uteri: Secondary | ICD-10-CM | POA: Diagnosis not present

## 2016-05-01 DIAGNOSIS — R0789 Other chest pain: Secondary | ICD-10-CM | POA: Diagnosis not present

## 2016-05-01 DIAGNOSIS — M25562 Pain in left knee: Secondary | ICD-10-CM | POA: Diagnosis not present

## 2016-05-01 DIAGNOSIS — Y999 Unspecified external cause status: Secondary | ICD-10-CM | POA: Insufficient documentation

## 2016-05-01 DIAGNOSIS — Y92410 Unspecified street and highway as the place of occurrence of the external cause: Secondary | ICD-10-CM | POA: Insufficient documentation

## 2016-05-01 DIAGNOSIS — M79662 Pain in left lower leg: Secondary | ICD-10-CM | POA: Diagnosis not present

## 2016-05-01 DIAGNOSIS — S299XXA Unspecified injury of thorax, initial encounter: Secondary | ICD-10-CM | POA: Diagnosis not present

## 2016-05-01 MED ORDER — IBUPROFEN 400 MG PO TABS
400.0000 mg | ORAL_TABLET | Freq: Once | ORAL | Status: AC
  Administered 2016-05-01: 400 mg via ORAL
  Filled 2016-05-01: qty 1

## 2016-05-01 MED ORDER — METHOCARBAMOL 500 MG PO TABS: 1000.0000 mg | ORAL_TABLET | Freq: Four times a day (QID) | ORAL | 0 refills | Status: DC | PRN

## 2016-05-01 NOTE — Discharge Instructions (Signed)
Take the prescription as directed. Take over the counter tylenol and ibuprofen, as directed on packaging, as needed for discomfort. Apply moist heat or ice to the area(s) of discomfort, for 15 minutes at a time, several times per day for the next few days.  Do not fall asleep on a heating or ice pack.  Wear the knee immobilizer and use the crutches when walking until you are seen in follow up. Call your regular medical doctor today to schedule a follow up appointment in the next 3 days. Call the Orthopedic doctor today to schedule a follow up appointment next week.  Return to the Emergency Department immediately if worsening.
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
## 2016-05-01 NOTE — ED Notes (Signed)
Cleaned scrapes to left knee with sur clens and ice pack given for knee.

## 2016-05-01 NOTE — ED Notes (Signed)
Pt requesting some ibuprofen.  Pt c/o some neck and head pain.  Dr. Clarene DukeMcmanus notified and orders received.
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
## 2016-05-01 NOTE — ED Notes (Signed)
Pt refused knee immobilizer and crutches.  

## 2016-05-18 DIAGNOSIS — E039 Hypothyroidism, unspecified: Secondary | ICD-10-CM | POA: Diagnosis not present

## 2016-05-18 DIAGNOSIS — E559 Vitamin D deficiency, unspecified: Secondary | ICD-10-CM | POA: Diagnosis not present

## 2016-05-18 DIAGNOSIS — R945 Abnormal results of liver function studies: Secondary | ICD-10-CM | POA: Diagnosis not present

## 2016-05-18 DIAGNOSIS — R600 Localized edema: Secondary | ICD-10-CM | POA: Diagnosis not present

## 2016-05-18 DIAGNOSIS — E785 Hyperlipidemia, unspecified: Secondary | ICD-10-CM | POA: Diagnosis not present

## 2016-06-19 ENCOUNTER — Emergency Department (HOSPITAL_COMMUNITY): Payer: 59

## 2016-06-19 ENCOUNTER — Emergency Department (HOSPITAL_COMMUNITY)
Admission: EM | Admit: 2016-06-19 | Discharge: 2016-06-19 | Disposition: A | Discharge status: Home / Self Care | Payer: 59 | Attending: Emergency Medicine | Admitting: Emergency Medicine

## 2016-06-19 ENCOUNTER — Telehealth: Payer: Self-pay

## 2016-06-19 ENCOUNTER — Encounter (HOSPITAL_COMMUNITY): Payer: Self-pay | Admitting: Emergency Medicine

## 2016-06-19 DIAGNOSIS — R079 Chest pain, unspecified: Secondary | ICD-10-CM | POA: Diagnosis not present

## 2016-06-19 DIAGNOSIS — R071 Chest pain on breathing: Secondary | ICD-10-CM | POA: Diagnosis not present

## 2016-06-19 DIAGNOSIS — R1011 Right upper quadrant pain: Secondary | ICD-10-CM | POA: Diagnosis not present

## 2016-06-19 DIAGNOSIS — R1013 Epigastric pain: Secondary | ICD-10-CM | POA: Insufficient documentation

## 2016-06-19 LAB — HEPATIC FUNCTION PANEL
ALT: 154 U/L — ABNORMAL HIGH (ref 14–54)
AST: 49 U/L — ABNORMAL HIGH (ref 15–41)
Albumin: 4.7 g/dL (ref 3.5–5.0)
Alkaline Phosphatase: 108 U/L (ref 38–126)
BILIRUBIN DIRECT: 0.1 mg/dL (ref 0.1–0.5)
BILIRUBIN INDIRECT: 0.8 mg/dL (ref 0.3–0.9)
BILIRUBIN TOTAL: 0.9 mg/dL (ref 0.3–1.2)
Total Protein: 7.7 g/dL (ref 6.5–8.1)

## 2016-06-19 LAB — CBC
HCT: 41.7 % (ref 36.0–46.0)
Hemoglobin: 14.5 g/dL (ref 12.0–15.0)
MCH: 30 pg (ref 26.0–34.0)
MCHC: 34.8 g/dL (ref 30.0–36.0)
MCV: 86.2 fL (ref 78.0–100.0)
PLATELETS: 169 10*3/uL (ref 150–400)
RBC: 4.84 MIL/uL (ref 3.87–5.11)
RDW: 11.9 % (ref 11.5–15.5)
WBC: 6.1 10*3/uL (ref 4.0–10.5)

## 2016-06-19 LAB — BASIC METABOLIC PANEL
ANION GAP: 6 (ref 5–15)
BUN: 12 mg/dL (ref 6–20)
CALCIUM: 10.3 mg/dL (ref 8.9–10.3)
CO2: 28 mmol/L (ref 22–32)
CREATININE: 0.87 mg/dL (ref 0.44–1.00)
Chloride: 101 mmol/L (ref 101–111)
GLUCOSE: 90 mg/dL (ref 65–99)
Potassium: 3.7 mmol/L (ref 3.5–5.1)
Sodium: 135 mmol/L (ref 135–145)

## 2016-06-19 LAB — D-DIMER, QUANTITATIVE (NOT AT ARMC): D DIMER QUANT: 0.48 ug{FEU}/mL (ref 0.00–0.50)

## 2016-06-19 LAB — I-STAT BETA HCG BLOOD, ED (MC, WL, AP ONLY)

## 2016-06-19 LAB — I-STAT TROPONIN, ED: TROPONIN I, POC: 0 ng/mL (ref 0.00–0.08)

## 2016-06-19 MED ORDER — IBUPROFEN 800 MG PO TABS
800.0000 mg | ORAL_TABLET | Freq: Once | ORAL | Status: AC
  Administered 2016-06-19: 800 mg via ORAL
  Filled 2016-06-19: qty 1

## 2016-06-19 MED ORDER — IBUPROFEN 800 MG PO TABS: 800.0000 mg | ORAL_TABLET | Freq: Three times a day (TID) | ORAL | 0 refills | Status: DC

## 2016-06-19 NOTE — ED Notes (Signed)
Returned from Ultrasound

## 2016-06-19 NOTE — ED Provider Notes (Signed)
Emergency Department Provider Note   I have reviewed the triage vital signs and the nursing notes.   HISTORY  Chief Complaint Chest Pain   HPI Heather [name] is a 35 y.o. female with PMH of anxiety and prior heart palpitations presents to the emergency room in for evaluation of 3 days of constant chest pain. Patient reports pain starting spontaneously 3 days ago in her left shoulder. She is comes pain is constant and occasionally worse with deep breathing. Pain is relieved with position changes, specifically sitting up and leaning to the right side. She reports some nasal congestion several days ago but no fevers. She has no associated dyspnea. No heart palpitations currently. She denies any change in her pain quality or severity with eating food. No history of DVT or PE. No recent surgeries or prolonged period of inactivity.    Past Medical History:  Diagnosis Date  . Abnormal Pap smear   . Adenotonsillar hypertrophy    denies snoring during sleep or apnea  . Anxiety   . HPV (human papilloma virus) infection   . IUD (intrauterine device) in place   . Palpitations   . Panic attack   . RUQ pain 01/02/2016  . Strep throat    to start antibiotic 08/20/2014    Patient Active Problem List   Diagnosis Date Noted  . RUQ pain 01/02/2016  . Arrhythmia 06/11/2014  . 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 Strain of hip 01/22/2014  . Anxiety 01/25/2013  . History of abnormal Pap smear 01/25/2013  . IUD (intrauterine device) in place 01/25/2013    Past Surgical History:  Procedure Laterality Date  . CERVICAL CONIZATION W/BX N/A 08/27/2014   Procedure: CONIZATION CERVIX ;  Surgeon: Lazaro Arms, MD;  Location: [facility];  Service: Gynecology;  Laterality: N/A;  Laser conization and ablation of cervix  . CO2 LASER APPLICATION N/A 08/27/2014   Procedure: CO2 LASER APPLICATION;  Surgeon: Lazaro Arms, MD;  Location: [facility];  Service:  Gynecology;  Laterality: N/A;  . TONSILLECTOMY AND ADENOIDECTOMY Bilateral 08/27/2014   Procedure: BILATERAL TONSILLECTOMY AND ADENOIDECTOMY;  Surgeon: Darletta Moll, MD;  Location: [facility];  Service: ENT;  Laterality: Bilateral;  . TYMPANOSTOMY TUBE PLACEMENT Bilateral     Current Outpatient Rx  . Order #: [id number] Class: Normal  . Order #: [id number] Class: Historical Med  . Order #: [id number] Class: Print  . Order #: [id number] Class: Print    Allergies Epinephrine; Lidocaine; and Penicillins  Family History  Problem Relation Age of Onset  . Hypertension Mother   . Heart disease Maternal Grandmother     pacemaker  . Hypertension Father   . Heart disease Father     pacemaker  . Cancer Paternal Grandfather     colon cancer  . Stroke Paternal Grandfather   . Macular degeneration Paternal Grandmother   . Leukemia Son   . Asthma Son     Social History Social History  Substance Use Topics  . Smoking status: Never Smoker  . Smokeless tobacco: Never Used  . Alcohol use No    Review of Systems  Constitutional: No fever/chills Eyes: No visual changes. ENT: No sore throat. Cardiovascular: Positive chest pain. Respiratory: Denies shortness of breath. Gastrointestinal: No abdominal pain.  No nausea, no vomiting.  No diarrhea.  No constipation. Genitourinary: Negative for dysuria. Musculoskeletal: Negative for back pain. Skin: Negative for rash. Neurological: Negative for headaches, focal weakness or numbness.  10-point  ROS otherwise negative.  ____________________________________________   PHYSICAL EXAM:  VITAL SIGNS: ED Triage Vitals  Enc Vitals Group     BP 06/19/16 1525 (!) 116/41     Pulse Rate 06/19/16 1525 63     Resp 06/19/16 1525 18     Temp 06/19/16 1525 98.4 F (36.9 C)     Temp Source 06/19/16 1525 Oral     SpO2 06/19/16 1525 100 %     Weight 06/19/16 1522 180 lb (81.6 kg)     Height 06/19/16 1522 5\' 5"  (1.651 m)     Pain Score 06/19/16  1523 8   Constitutional: Alert and oriented. Well appearing and in no acute distress. Eyes: Conjunctivae are normal. Head: Atraumatic. Nose: No congestion/rhinnorhea. Mouth/Throat: Mucous membranes are moist.  Oropharynx non-erythematous. Neck: No stridor.  Cardiovascular: Normal rate, regular rhythm. Good peripheral circulation. Grossly normal heart sounds.   Respiratory: Normal respiratory effort.  No retractions. Lungs CTAB. Gastrointestinal: Soft and nontender. No distention.  Musculoskeletal: No lower extremity tenderness nor edema. No gross deformities of extremities. Neurologic:  Normal speech and language. No gross focal neurologic deficits are appreciated.  Skin:  Skin is warm, dry and intact. No rash noted. Psychiatric: Mood and affect are normal. Speech and behavior are normal.  ____________________________________________   LABS (all labs ordered are listed, but only abnormal results are displayed)  Labs Reviewed  HEPATIC FUNCTION PANEL - Abnormal; Notable for the following:       Result Value   AST 49 (*)    ALT 154 (*)    All other components within normal limits  BASIC METABOLIC PANEL  CBC  D-DIMER, QUANTITATIVE (NOT AT [facility])  I-STAT TROPOININ, ED  I-STAT BETA HCG BLOOD, ED ([facility], [facility], [facility] ONLY)   ____________________________________________  EKG   EKG Interpretation  Date/Time:  Friday June 19 2016 15:23:19 EDT Ventricular Rate:  70 PR Interval:  166 QRS Duration: 84 QT Interval:  374 QTC Calculation: 403 R Axis:   51 Text Interpretation:  Normal sinus rhythm with sinus arrhythmia Cannot rule out Anterior infarct , age undetermined Abnormal ECG No STEMI. Similar to prior.  Confirmed by Daley Gosse MD, Anber Mckiver [phone] on 06/19/2016 3:51:19 PM       ____________________________________________  RADIOLOGY  Dg Chest 2 View  Result Date: 06/19/2016 CLINICAL DATA:  Mid left-sided chest pain for 3 days. EXAM: CHEST  2 VIEW COMPARISON:  None. FINDINGS: The  heart size and mediastinal contours are within normal limits. Both lungs are clear. The visualized skeletal structures are unremarkable. IMPRESSION: No active cardiopulmonary disease. Electronically Signed   By: Tollie Ethavid  Kwon M.D.   On: 06/19/2016 16:16   [place] Abdomen Limited Ruq  Result Date: 06/19/2016 CLINICAL DATA:  Right upper quadrant pain for 3 weeks EXAM: US ABDOMEN LIMITED - RIGHT UPPER QUADRANT COMPARISON:  None. FINDINGS: Gallbladder: No gallstones or wall thickening visualized. No sonographic Murphy sign noted. Common bile duct: Diameter: 3 mm Liver: No focal lesion identified. Within normal limits in parenchymal echogenicity. IMPRESSION: Gallbladder, biliary tree, and liver are within normal limits. Electronically Signed   By: Jolaine ClickArthur  Hoss M.D.   On: 06/19/2016 17:29    ____________________________________________   PROCEDURES  Procedure(s) performed:   Procedures  None ____________________________________________   INITIAL IMPRESSION / ASSESSMENT AND PLAN / ED COURSE  Pertinent labs & imaging results that were available during my care of the patient were reviewed by me and considered in my medical decision making (see chart for details).  Patient resents to  the emergency department for evaluation of constant chest pain for the last 3 days. Pain is slightly pleuritic. Patient is using estrogen-containing IUD. Vital signs are largely unremarkable and overall clinical suspicion for pulmonary embolus is low. Very low suspicion for ACS. The positional quality of her pain elevates pericarditis on my differential however her EKG is not classic for this. Also considering gallbladder pathology but symptoms do not start with eating and her abdomen is soft and nontender.   Initial troponin and labs are negative. Patient's liver enzymes are slightly elevated compared to her baseline. With increased symptoms with question of gallbladder pathology in the past will obtain RUQ [place].   RUQ [place]  negative for acute gallbladder pathology. Plan to discharge home with return precautions.   At this time, I do not feel there is any life-threatening condition present. I have reviewed and discussed all results (EKG, imaging, lab, urine as appropriate), exam findings with patient. I have reviewed nursing notes and appropriate previous records.  I feel the patient is safe to be discharged home without further emergent workup. Discussed usual and customary return precautions. Patient and family (if present) verbalize understanding and are comfortable with this plan.  Patient will follow-up with their primary care provider. If they do not have a primary care provider, information for follow-up has been provided to them. All questions have been answered.   ____________________________________________  FINAL CLINICAL IMPRESSION(S) / ED DIAGNOSES  Final diagnoses:  Epigastric pain  Nonspecific chest pain     MEDICATIONS GIVEN DURING THIS VISIT:  Medications  ibuprofen (ADVIL,MOTRIN) tablet 800 mg (800 mg Oral Given 06/19/16 1630)     NEW OUTPATIENT MEDICATIONS STARTED DURING THIS VISIT:  None   Note:  This document was prepared using Dragon voice recognition software and may include unintentional dictation errors.  Alona Bene, MD Emergency Medicine   Maia Plan, MD 06/20/16 [phone]

## 2016-06-19 NOTE — ED Triage Notes (Signed)
Pt reports 3 day hx of chest pain. Pt states it started on the L but now is more centralized. Pt c/o nausea that started today. Pt reports change in position helps with pressure.

## 2016-06-19 NOTE — Telephone Encounter (Signed)
Has had several weeks episode RUQ pain, chest discomfort,explained we have a no walk in apt available,advised to go to ED,pt states she has a child in car and will not wait.is going to call Dr Lanier ClamJenkins,has issues with gallbladder, was offered next available apt,did not make one though  Will FYI Dr Purvis SheffieldKoneswaran

## 2016-06-19 NOTE — Discharge Instructions (Signed)

## 2016-06-22 ENCOUNTER — Other Ambulatory Visit (HOSPITAL_COMMUNITY): Payer: Self-pay | Admitting: Internal Medicine

## 2016-06-22 DIAGNOSIS — R1011 Right upper quadrant pain: Secondary | ICD-10-CM

## 2016-06-22 DIAGNOSIS — Z23 Encounter for immunization: Secondary | ICD-10-CM | POA: Diagnosis not present

## 2016-06-22 DIAGNOSIS — R945 Abnormal results of liver function studies: Secondary | ICD-10-CM | POA: Diagnosis not present

## 2016-06-23 ENCOUNTER — Encounter (HOSPITAL_COMMUNITY)
Admission: RE | Admit: 2016-06-23 | Discharge: 2016-06-23 | Disposition: A | Discharge status: Home / Self Care | Payer: 59 | Source: Ambulatory Visit | Attending: Internal Medicine | Admitting: Internal Medicine

## 2016-06-23 ENCOUNTER — Encounter (HOSPITAL_COMMUNITY): Payer: Self-pay

## 2016-06-23 DIAGNOSIS — K828 Other specified diseases of gallbladder: Secondary | ICD-10-CM | POA: Insufficient documentation

## 2016-06-23 DIAGNOSIS — R109 Unspecified abdominal pain: Secondary | ICD-10-CM | POA: Diagnosis not present

## 2016-06-23 DIAGNOSIS — R1011 Right upper quadrant pain: Secondary | ICD-10-CM | POA: Insufficient documentation

## 2016-06-23 MED ORDER — TECHNETIUM TC 99M MEBROFENIN IV KIT
5.0000 | PACK | Freq: Once | INTRAVENOUS | Status: AC | PRN
  Administered 2016-06-23: 5.4 via INTRAVENOUS

## 2016-06-25 ENCOUNTER — Encounter (INDEPENDENT_AMBULATORY_CARE_PROVIDER_SITE_OTHER): Payer: Self-pay | Admitting: *Deleted

## 2016-07-07 ENCOUNTER — Encounter (INDEPENDENT_AMBULATORY_CARE_PROVIDER_SITE_OTHER): Payer: Self-pay | Admitting: Internal Medicine

## 2016-07-07 ENCOUNTER — Ambulatory Visit (INDEPENDENT_AMBULATORY_CARE_PROVIDER_SITE_OTHER): Payer: 59 | Admitting: Internal Medicine

## 2016-07-09 ENCOUNTER — Telehealth: Payer: Self-pay

## 2016-07-09 ENCOUNTER — Ambulatory Visit (INDEPENDENT_AMBULATORY_CARE_PROVIDER_SITE_OTHER): Payer: 59 | Admitting: Gastroenterology

## 2016-07-09 ENCOUNTER — Encounter: Payer: Self-pay | Admitting: Gastroenterology

## 2016-07-09 ENCOUNTER — Other Ambulatory Visit: Payer: Self-pay

## 2016-07-09 VITALS — BP 113/64 | HR 67 | Temp 97.8°F | Ht 65.0 in | Wt 182.8 lb

## 2016-07-09 DIAGNOSIS — R7989 Other specified abnormal findings of blood chemistry: Secondary | ICD-10-CM

## 2016-07-09 DIAGNOSIS — K828 Other specified diseases of gallbladder: Secondary | ICD-10-CM | POA: Insufficient documentation

## 2016-07-09 DIAGNOSIS — R945 Abnormal results of liver function studies: Principal | ICD-10-CM

## 2016-07-09 NOTE — Telephone Encounter (Signed)
Called pt and informed of appt with surgeon (Dr. Lovell SheehanJenkins) 07/14/16 at 9:30 am. Pt agreeable to seeing Dr. Lovell SheehanJenkins d/t sooner appt.

## 2016-07-09 NOTE — Assessment & Plan Note (Addendum)
35 year old female with elevated transaminases dating back to February/March of this year, with symptomatology pointing to biliary etiology. Please see HPI for full details. Although ASMA is elevated, this is non-specific in this setting. I highly doubt she has an autoimmune hepatitis or need for further autoimmune/PBC serologies in this scenario. Encouragingly, her Hep C and Hep B serologies are negative. US abdomen without gallstones and a normal liver; however, she she has had a marked decline in ejection fraction as noted on HIDA from earlier this year, along with worsening reproduction of symptoms during exam. Her symptoms of chest/abdominal discomfort are also directly correlated with eating. I feel her intermittent fluctuations in transaminases and abdominal/chest discomfort is secondary to biliary etiology.   Will refer to General Surgery (Dr. Suzi RootsJenkins/Dr. Davis) for consideration of laparoscopic cholecystectomy. If feasible, will ask if a liver biopsy could be performed at time of cholecystectomy if surgery feels this is appropriate/permissible as she had a positive ASMA (again, I feel this was non-specific).

## 2016-07-09 NOTE — Patient Instructions (Signed)
We have referred you to Dr. Earlene Plateravis to have a cholecystectomy. I am also asking him to take a liver biopsy at time of the surgery if possible.   Further recommendations to follow.

## 2016-07-09 NOTE — Progress Notes (Addendum)
REVIEWED-NO ADDITIONAL RECOMMENDATIONS.  Primary Care Physician:  Wilson SingerGOSRANI,NIMISH C, MD Primary Gastroenterologist:  Dr. Darrick PennaFields   Chief Complaint  Patient presents with  . Abdominal Pain    HPI:   Heather [name] is a 35 y.o. female presenting today asa self-referral secondary to abdominal pain. She has previously been seen by another GI practice in our area and requested a second opinion. She is followed closely by Dr. Karilyn CotaGosrani.   She notes symptom onset of chest and RUQ abdominal pain around March or April. At first was in constant pain but now with intermittent episodes. Now has underlying heaviness in her chest and eating always precipitates symptoms. Has  episodes of chest discomfort starting on left upper chest,, moves to middle chest, and then radiates down to upper abdomen and under ribs. When eating, has chest discomfort. When eating, feels like something is crawling in her chest. If leans on right side, will take pressure off. Will have occasional nausea. No vomiting. A lot of belching. Eating precipitates symptoms; however, when symptoms first started, it came regardless of eating. She has had associated fluctuations of transaminases with ANA negative, ASMA positive but non-specific in this scenario. She is negative for Hep C antibody, Hep B surface antigen. ALT has been greater than 2 times the upper limits of normal on several occasions with AST 2 times the upper limits of normal intermittently, with intermittent fluctuations since Feb/March, when pain started. She has had two ultrasounds (March 2017 and Sept 2017), both without gallstones and a normal liver. HIDA scan was also completed twice (March and October), with EF of 59% in March and mild chest discomfort noted by patient. HIDA in Oct 2017 completed with EF of 31%, decreased from prior exam, with patient reporting to me today that she had severe chest discomfort and nausea with Ensure during HIDA scan.   She is trying to eat  bland foods, but these still precipitate her symptoms. She has also been evaluated by cardiology for chest discomfort.     Past Medical History:  Diagnosis Date  . Abnormal Pap smear   . Adenotonsillar hypertrophy    denies snoring during sleep or apnea  . Anxiety   . HPV (human papilloma virus) infection   . IUD (intrauterine device) in place   . Palpitations   . Panic attack   . RUQ pain 01/02/2016  . Strep throat    to start antibiotic 08/20/2014    Past Surgical History:  Procedure Laterality Date  . CERVICAL CONIZATION W/BX N/A 08/27/2014   Procedure: CONIZATION CERVIX ;  Surgeon: Lazaro ArmsLuther H Eure, MD;  Location: [facility];  Service: Gynecology;  Laterality: N/A;  Laser conization and ablation of cervix  . CO2 LASER APPLICATION N/A 08/27/2014   Procedure: CO2 LASER APPLICATION;  Surgeon: Lazaro ArmsLuther H Eure, MD;  Location: [facility];  Service: Gynecology;  Laterality: N/A;  . TONSILLECTOMY AND ADENOIDECTOMY Bilateral 08/27/2014   Procedure: BILATERAL TONSILLECTOMY AND ADENOIDECTOMY;  Surgeon: Darletta MollSui W Teoh, MD;  Location:  SURGERY CENTER;  Service: ENT;  Laterality: Bilateral;  . TYMPANOSTOMY TUBE PLACEMENT Bilateral     Current Outpatient Prescriptions  Medication Sig Dispense Refill  . calcium carbonate (TUMS EX) 750 MG chewable tablet Chew 1 tablet by mouth daily.    . citalopram (CELEXA) 20 MG tablet Take 1/2 tablet daaily 15 tablet 11  . ibuprofen (ADVIL,MOTRIN) 800 MG tablet Take 1 tablet (800 mg total) by mouth 3 (three) times daily. 21 tablet  0  . levonorgestrel (MIRENA) 20 MCG/24HR IUD 1 each by Intrauterine route once.    . methocarbamol (ROBAXIN) 500 MG tablet Take 2 tablets (1,000 mg total) by mouth 4 (four) times daily as needed for muscle spasms (muscle spasm/pain). (Patient not taking: Reported on 07/09/2016) 25 tablet 0   No current facility-administered medications for this visit.     Allergies as of 07/09/2016 - Review Complete  07/09/2016  Allergen Reaction Noted  . Epinephrine Other (See Comments) 06/11/2014  . Lidocaine Other (See Comments) 01/22/2014  . Penicillins Hives 01/24/2013    Family History  Problem Relation Age of Onset  . Hypertension Mother   . Heart disease Maternal Grandmother     pacemaker  . Hypertension Father   . Heart disease Father     pacemaker  . Cancer Paternal Grandfather     colon cancer  . Stroke Paternal Grandfather   . Macular degeneration Paternal Grandmother   . Leukemia Son   . Asthma Son     Social History   Social History  . Marital status: Married    Spouse name: N/A  . Number of children: N/A  . Years of education: N/A   Occupational History  . Not on file.   Social History Main Topics  . Smoking status: Never Smoker  . Smokeless tobacco: Never Used  . Alcohol use No  . Drug use: No  . Sexual activity: Yes    Birth control/ protection: IUD   Other Topics Concern  . Not on file   Social History Narrative  . No narrative on file    Review of Systems: As mentioned in HPI   Physical Exam: BP 113/64   Pulse 67   Temp 97.8 F (36.6 C) (Oral)   Ht 5\' 5"  (1.651 m)   Wt 182 lb 12.8 oz (82.9 kg)   BMI 30.42 kg/m  General:   Alert and oriented. Pleasant and cooperative. Well-nourished and well-developed.  Head:  Normocephalic and atraumatic. Eyes:  Without icterus, sclera clear and conjunctiva pink.  Ears:  Normal auditory acuity. Nose:  No deformity, discharge,  or lesions. Lungs:  Clear to auscultation bilaterally. No wheezes, rales, or rhonchi. No distress.  Heart:  S1, S2 present without murmurs appreciated.  Abdomen:  +BS, soft, moderately TTP epigastric/LUQ and non-distended. No HSM noted. No guarding or rebound. No masses appreciated.  Rectal:  Deferred  Msk:  Symmetrical without gross deformities. Normal posture. Extremities:  Without  edema. Neurologic:  Alert and  oriented x4;  grossly normal neurologically. Psych:  Alert and  cooperative. Normal mood and affect.   Lab Results  Component Value Date   WBC 6.1 06/19/2016   HGB 14.5 06/19/2016   HCT 41.7 06/19/2016   MCV 86.2 06/19/2016   PLT 169 06/19/2016   Lab Results  Component Value Date   ALT 154 (H) 06/19/2016   AST 49 (H) 06/19/2016   ALKPHOS 108 06/19/2016   BILITOT 0.9 06/19/2016   Lab Results  Component Value Date   CREATININE 0.87 06/19/2016   BUN 12 06/19/2016   NA 135 06/19/2016   K 3.7 06/19/2016   CL 101 06/19/2016   CO2 28 06/19/2016

## 2016-07-09 NOTE — Progress Notes (Signed)
cc'ed to pcp °

## 2016-07-14 DIAGNOSIS — K811 Chronic cholecystitis: Secondary | ICD-10-CM | POA: Diagnosis not present

## 2016-07-14 NOTE — H&P (Signed)
  NTS SOAP Note  Vital Signs:  Vitals as of: 07/14/2016: Systolic 106: Diastolic 62: Heart Rate 66: Temp 98.59F (Temporal): Height 835ft 5in: Weight 183Lbs 0 Ounces: BMI 30.45   BMI : 30.45 kg/m2  Subjective: This 35 year old female presents for of biliary colic.  Has been having right upper quadrant abdominal pain with radiation to the chest and right flank, nausea, bloating, and fatty food intolerance intermittently for 5 months.  Is occurring almost daily.  Pain is 5/10, resolves on its own.  No fever, chills, jaundice.  Referred by Dr. Darrick PennaFields.  Review of Symptoms:  Constitutional:negative Head:negative Eyes:blurred vision bilateral Nose/Mouth/Throat:negative Cardiovascular:negative Respiratory:cough Gastrointestinabdominal pain, nausea, heartburn, dyspepsia Genitourinary:negative Musculoskeletal:negative Skin:negative Hematolgic/Lymphatic:negative Allergic/Immunologic:negative   Past Medical History:Reviewed  Past Medical History  Surgical History: tonsillectomy, laser of cervix Medical Problems: anxiety Allergies: lidocaine, epinephrine Medications: celexa, mirena   Social History:Reviewed  Social History  Preferred Language: English Race:  White Ethnicity: Not Hispanic / Latino Age: 35 year Marital Status:  S Alcohol: no   Smoking Status: Never smoker reviewed on 07/14/2016 Functional Status reviewed on 07/14/2016 ------------------------------------------------ Bathing: Normal Cooking: Normal Dressing: Normal Driving: Normal Eating: Normal Managing Meds: Normal Oral Care: Normal Shopping: Normal Toileting: Normal Transferring: Normal Walking: Normal Cognitive Status reviewed on 07/14/2016 ------------------------------------------------ Attention: Normal Decision Making: Normal Language: Normal Memory: Normal Motor: Normal Perception: Normal Problem Solving: Normal Visual and Spatial: Normal   Family  History:Reviewed  Family Health History Mother, Living; Hypertension (high blood pressure);  Father, Living; Heart disease;     Objective Information: General:Well appearing, well nourished in no distress. Skin:no rash or prominent lesions Head:Atraumatic; no masses; no abnormalities no scleral icterus Neck:Supple without lymphadenopathy.  Heart:RRR, no murmur or gallop.  Normal S1, S2.  No S3, S4.  Lungs:CTA bilaterally, no wheezes, rhonchi, rales.  Breathing unlabored. Abdomen:Soft, slight discomfort in right upper quadrant to palpation, ND, normal bowel sounds, no HSM, no masses.  No peritoneal signs. GI notes reviewed.  Labs and HIDA scan reports reviewed. Assessment:Chronic cholecystitis, elevated liver tests  Diagnoses: 575.11  K81.1 Chronic cholecystitis without calculus (Chronic cholecystitis)  Procedures: [id number] - OFFICE OUTPATIENT NEW 30 MINUTES    Plan:  Scheduled for laparoscopic cholecystectomy, liver biopsy on 07/22/16.   Patient Education:Alternative treatments to surgery were discussed with patient (and family).Risks and benefits  of procedure including bleeding, infection, hepatobiliary injury, and the possibility of an open procedure were fully explained to the patient (and family) who gave informed consent. Patient/family questions were addressed.  Follow-up:Pending Surgery

## 2016-07-16 NOTE — Patient Instructions (Addendum)
Your procedure is scheduled on: 07/22/2016  [facility]    [facility]:15 AM.  Call this number if you have problems the morning of surgery: [phone]   Remember:   Do not drink or eat food:After Midnight.  :  Take these medicines the morning of surgery with A SIP OF WATER: Zyrtec and Celexa   Do not wear jewelry, make-up or nail polish.  Do not wear lotions, powders, or perfumes. You may wear deodorant.  Do not shave 48 hours prior to surgery. Men may shave face and neck.  Do not bring valuables to the hospital.  Contacts, dentures or bridgework may not be worn into surgery.  Leave suitcase in the car. After surgery it may be brought to your room.  For patients admitted to the hospital, checkout time is 11:00 AM the day of discharge.   Patients discharged the day of surgery will not be allowed to drive home.    Special Instructions: Shower using CHG night before surgery and shower the day of surgery use CHG.  Use special wash - you have one bottle of CHG for all showers.  You should use approximately 1/2 of the bottle for each shower. General Anesthesia, Adult, Care After Refer to this sheet in the next few weeks. These instructions provide you with information on caring for yourself after your procedure. Your health care provider may also give you more specific instructions. Your treatment has been planned according to current medical practices, but problems sometimes occur. Call your health care provider if you have any problems or questions after your procedure. WHAT TO EXPECT AFTER THE PROCEDURE After the procedure, it is typical to experience:  Sleepiness.  Nausea and vomiting. HOME CARE INSTRUCTIONS  For the first 24 hours after general anesthesia:  Have a responsible person with you.  Do not drive a car. If you are alone, do not take public transportation.  Do not drink alcohol.  Do not take medicine that has not been prescribed by your health care  provider.  Do not sign important papers or make important decisions.  You may resume a normal diet and activities as directed by your health care provider.  Change bandages (dressings) as directed.  If you have questions or problems that seem related to general anesthesia, call the hospital and ask for the anesthetist or anesthesiologist on call. SEEK MEDICAL CARE IF:  You have nausea and vomiting that continue the day after anesthesia.  You develop a rash. SEEK IMMEDIATE MEDICAL CARE IF:   You have difficulty breathing.  You have chest pain.  You have any allergic problems.   This information is not intended to replace advice given to you by your health care provider. Make sure you discuss any questions you have with your health care provider.   Document Released: 12/14/2000 Document Revised: 09/28/2014 Document Reviewed: 01/06/2012 Elsevier Interactive Patient Education 2016 [employer]  Laparoscopic Cholecystectomy, Care After Refer to this sheet in the next few weeks. These instructions provide you with information about caring for yourself after your procedure. Your health care provider may also give you more specific instructions. Your treatment has been planned according to current medical practices, but problems sometimes occur. Call your health care provider if you have any problems or questions after your procedure. WHAT TO EXPECT AFTER THE PROCEDURE After your procedure, it is common to have:  Pain at your incision sites. You will be given pain medicines to control your pain.  Mild nausea or  vomiting. This should improve after the first 24 hours.  Bloating and possible shoulder pain from the gas that was used during the procedure. This will improve after the first 24 hours. HOME CARE INSTRUCTIONS Incision Care  Follow instructions from your health care provider about how to take care of your incisions. Make sure you:  Wash your hands with soap and water before  you change your bandage (dressing). If soap and water are not available, use hand sanitizer.  Change your dressing as told by your health care provider.  Leave stitches (sutures), skin glue, or adhesive strips in place. These skin closures may need to be in place for 2 weeks or longer. If adhesive strip edges start to loosen and curl up, you may trim the loose edges. Do not remove adhesive strips completely unless your health care provider tells you to do that.  Do not take baths, swim, or use a hot tub until your health care provider approves. Ask your health care provider if you can take showers. You may only be allowed to take sponge baths for bathing. General Instructions  Take over-the-counter and prescription medicines only as told by your health care provider.  Do not drive or operate heavy machinery while taking prescription pain medicine.  Return to your normal diet as told by your health care provider.  Do not lift anything that is heavier than 10 lb (4.5 kg).  Do not play contact sports for one week or until your health care provider approves. SEEK MEDICAL CARE IF:   You have redness, swelling, or pain at the site of your incision.  You have fluid, blood, or pus coming from your incision.  You notice a bad smell coming from your incision area.  Your surgical incisions break open.  You have a fever. SEEK IMMEDIATE MEDICAL CARE IF:  You develop a rash.  You have difficulty breathing.  You have chest pain.  You have increasing pain in your shoulders (shoulder strap areas).  You faint or have dizzy episodes while you are standing.  You have severe pain in your abdomen.  You have nausea or vomiting that lasts for more than one day.   This information is not intended to replace advice given to you by your health care provider. Make sure you discuss any questions you have with your health care provider.   Document Released: 09/07/2005 Document Revised: 05/29/2015  Document Reviewed: 04/19/2013 Elsevier Interactive Patient Education 2016 Else[employer] Inc[employer]  Liver Biopsy, Care After These instructions give you information on caring for yourself after your procedure. Your doctor may also give you more specific instructions. Call your doctor if you have any problems or questions after your procedure. HOME CARE  Rest at home for 1-2 days or as told by your doctor.  Have someone stay with you for at least 24 hours.  Do not do these things in the first 24 hours:  Drive.  Use machinery.  Take care of other people.  Sign legal documents.  Take a bath or shower.  There are many different ways to close and cover a cut (incision). For example, a cut can be closed with stitches, skin glue, or adhesive strips. Follow your doctor's instructions on:  Taking care of your cut.  Changing and removing your bandage (dressing).  Removing whatever was used to close your cut.  Do not drink alcohol in the first week.  Do not lift more than 5 pounds or play contact sports for the first 2 weeks.  Take medicines only as told by your doctor. For 1 week, do not take medicine that has aspirin in it or medicines like ibuprofen.  Get your test results. GET HELP IF:  A cut bleeds and leaves more than just a small spot of blood.  A cut is red, puffs up (swells), or hurts more than before.  Fluid or something else comes from a cut.  A cut smells bad.  You have a fever or chills. GET HELP RIGHT AWAY IF:  You have swelling, bloating, or pain in your belly (abdomen).  You get dizzy or faint.  You have a rash.  You feel sick to your stomach (nauseous) or throw up (vomit).  You have trouble breathing, feel short of breath, or feel faint.  Your chest hurts.  You have problems talking or seeing.  You have trouble balancing or moving your arms or legs.   This information is not intended to replace advice given to you by your health care provider. Make sure  you discuss any questions you have with your health care provider.   Document Released: 06/16/2008 Document Revised: 09/28/2014 Document Reviewed: 11/03/2013 Elsevier Interactive Patient Education [employer]

## 2016-07-17 ENCOUNTER — Encounter (HOSPITAL_COMMUNITY)
Admission: RE | Admit: 2016-07-17 | Discharge: 2016-07-17 | Disposition: A | Discharge status: Home / Self Care | Payer: 59 | Source: Ambulatory Visit | Attending: General Surgery | Admitting: General Surgery

## 2016-07-20 ENCOUNTER — Encounter (HOSPITAL_COMMUNITY): Payer: Self-pay

## 2016-07-20 ENCOUNTER — Encounter (HOSPITAL_COMMUNITY)
Admission: RE | Admit: 2016-07-20 | Discharge: 2016-07-20 | Disposition: A | Discharge status: Home / Self Care | Payer: 59 | Source: Ambulatory Visit | Attending: General Surgery | Admitting: General Surgery

## 2016-07-20 DIAGNOSIS — K759 Inflammatory liver disease, unspecified: Secondary | ICD-10-CM | POA: Diagnosis not present

## 2016-07-20 DIAGNOSIS — K811 Chronic cholecystitis: Secondary | ICD-10-CM | POA: Insufficient documentation

## 2016-07-20 DIAGNOSIS — K219 Gastro-esophageal reflux disease without esophagitis: Secondary | ICD-10-CM | POA: Insufficient documentation

## 2016-07-20 DIAGNOSIS — Z79899 Other long term (current) drug therapy: Secondary | ICD-10-CM | POA: Diagnosis not present

## 2016-07-20 DIAGNOSIS — Z793 Long term (current) use of hormonal contraceptives: Secondary | ICD-10-CM | POA: Insufficient documentation

## 2016-07-20 HISTORY — DX: Gastro-esophageal reflux disease without esophagitis: K21.9

## 2016-07-20 HISTORY — DX: Adverse effect of unspecified anesthetic, initial encounter: T41.45XA

## 2016-07-20 HISTORY — DX: Other complications of anesthesia, initial encounter: T88.59XA

## 2016-07-20 LAB — CBC WITH DIFFERENTIAL/PLATELET
BASOS PCT: 1 %
Basophils Absolute: 0 10*3/uL (ref 0.0–0.1)
EOS ABS: 0.2 10*3/uL (ref 0.0–0.7)
Eosinophils Relative: 3 %
HCT: 42.2 % (ref 36.0–46.0)
Hemoglobin: 14.3 g/dL (ref 12.0–15.0)
Lymphocytes Relative: 38 %
Lymphs Abs: 2.3 10*3/uL (ref 0.7–4.0)
MCH: 29.9 pg (ref 26.0–34.0)
MCHC: 33.9 g/dL (ref 30.0–36.0)
MCV: 88.1 fL (ref 78.0–100.0)
MONO ABS: 0.5 10*3/uL (ref 0.1–1.0)
MONOS PCT: 7 %
Neutro Abs: 3.1 10*3/uL (ref 1.7–7.7)
Neutrophils Relative %: 51 %
PLATELETS: 173 10*3/uL (ref 150–400)
RBC: 4.79 MIL/uL (ref 3.87–5.11)
RDW: 12.1 % (ref 11.5–15.5)
WBC: 6.1 10*3/uL (ref 4.0–10.5)

## 2016-07-20 LAB — HCG, SERUM, QUALITATIVE: Preg, Serum: NEGATIVE

## 2016-07-20 LAB — HEPATIC FUNCTION PANEL
ALT: 99 U/L — AB (ref 14–54)
AST: 36 U/L (ref 15–41)
Albumin: 4.5 g/dL (ref 3.5–5.0)
Alkaline Phosphatase: 103 U/L (ref 38–126)
BILIRUBIN DIRECT: 0.2 mg/dL (ref 0.1–0.5)
BILIRUBIN INDIRECT: 0.7 mg/dL (ref 0.3–0.9)
BILIRUBIN TOTAL: 0.9 mg/dL (ref 0.3–1.2)
Total Protein: 7.6 g/dL (ref 6.5–8.1)

## 2016-07-20 LAB — BASIC METABOLIC PANEL
Anion gap: 7 (ref 5–15)
BUN: 12 mg/dL (ref 6–20)
CALCIUM: 9.5 mg/dL (ref 8.9–10.3)
CO2: 26 mmol/L (ref 22–32)
CREATININE: 0.81 mg/dL (ref 0.44–1.00)
Chloride: 104 mmol/L (ref 101–111)
GFR calc non Af Amer: >60 mL/min (ref >60)
GLUCOSE: 94 mg/dL (ref 65–99)
Potassium: 3.9 mmol/L (ref 3.5–5.1)
Sodium: 137 mmol/L (ref 135–145)

## 2016-07-20 NOTE — Patient Instructions (Signed)
Heather [name]  07/20/2016     @PREFPERIOPPHARMACY @   Your procedure is scheduled on  07/22/2016   [facility]  [facility].M.  Call this number if you have problems the morning of surgery:  [phone]   Remember:  Do not eat food or drink liquids after midnight.  Take these medicines the morning of surgery with A SIP OF WATER  Zyrtec, celexa.   Do not wear jewelry, make-up or nail polish.  Do not wear lotions, powders, or perfumes, or deoderant.  Do not shave 48 hours prior to surgery.  Men may shave face and neck.  Do not bring valuables to the hospital.  [facility] not responsible [facility] any belongings or valuables.  Contacts, dentures or bridgework may not be worn into surgery.  Leave your suitcase in the car.  After surgery it may be brought to your room.  For patients admitted to the hospital, discharge time will be determined by your treatment team.  Patients discharged the day of surgery will not be allowed to drive home.   Name and phone number of your driver:   family Special instructions:  none  Please read over the following fact sheets that you were given. Anesthesia Post-op Instructions and Care and Recovery After Surgery       Laparoscopic Cholecystectomy Laparoscopic cholecystectomy is surgery to remove the gallbladder. The gallbladder is located in the upper right part of the abdomen, behind the liver. It is a storage sac for bile, which is produced in the liver. Bile aids in the digestion and absorption of fats. Cholecystectomy is often done for inflammation of the gallbladder (cholecystitis). This condition is usually caused by a buildup of gallstones (cholelithiasis) in the gallbladder. Gallstones can block the flow of bile, and that can result in inflammation and pain. In severe cases, emergency surgery may be required. If emergency surgery is not required, you will have time to prepare for the procedure. Laparoscopic  surgery is an alternative to open surgery. Laparoscopic surgery has a shorter recovery time. Your common bile duct may also need to be examined during the procedure. If stones are found in the common bile duct, they may be removed. LET [facility]ALTH CARE PROVIDER KNOW ABOUT:  Any allergies you have.  All medicines you are taking, including vitamins, herbs, eye drops, creams, and over-the-counter medicines.  Previous problems you or members of your family have had with the use of anesthetics.  Any blood disorders you have.  Previous surgeries you have had.  Any medical conditions you have. RISKS AND COMPLICATIONS Generally, this is a safe procedure. However, problems may occur, including:  Infection.  Bleeding.  Allergic reactions to medicines.  Damage to other structures or organs.  A stone remaining in the common bile duct.  A bile leak from the cyst duct that is clipped when your gallbladder is removed.  The need to convert to open surgery, which requires a larger incision in the abdomen. This may be necessary if your surgeon thinks that it is not safe to continue with a laparoscopic procedure. BEFORE THE PROCEDURE  Ask your health care provider about:  Changing or stopping your regular medicines. This is especially important if you are taking diabetes medicines or blood thinners.  Taking medicines such as aspirin and ibuprofen. These medicines can thin your blood. Do not take these medicines before your procedure if your health care provider  instructs you not to.  Follow instructions from your health care provider about eating or drinking restrictions.  Let your health care provider know if you develop a cold or an infection before surgery.  Plan to have someone take you home after the procedure.  Ask your health care provider how your surgical site will be marked or identified.  You may be given antibiotic medicine to help prevent infection. PROCEDURE  To reduce  your risk of infection:  Your health care team will wash or sanitize their hands.  Your skin will be washed with soap.  An IV tube may be inserted into one of your veins.  You will be given a medicine to make you fall asleep (general anesthetic).  A breathing tube will be placed in your mouth.  The surgeon will make several small cuts (incisions) in your abdomen.  A thin, lighted tube (laparoscope) that has a tiny camera on the end will be inserted through one of the small incisions. The camera on the laparoscope will send a picture to a TV screen (monitor) in the operating room. This will give the surgeon a good view inside your abdomen.  A gas will be pumped into your abdomen. This will expand your abdomen to give the surgeon more room to perform the surgery.  Other tools that are needed for the procedure will be inserted through the other incisions. The gallbladder will be removed through one of the incisions.  After your gallbladder has been removed, the incisions will be closed with stitches (sutures), staples, or skin glue.  Your incisions may be covered with a bandage (dressing). The procedure may vary among health care providers and hospitals. AFTER THE PROCEDURE  Your blood pressure, heart rate, breathing rate, and blood oxygen level will be monitored often until the medicines you were given have worn off.  You will be given medicines as needed to control your pain.   This information is not intended to replace advice given to you by your health care provider. Make sure you discuss any questions you have with your health care provider.   Document Released: 09/07/2005 Document Revised: 05/29/2015 Document Reviewed: 04/19/2013 Elsevier Interactive Patient Education 2016 [employer]  Laparoscopic Cholecystectomy, Care After These instructions give you information about caring for yourself after your procedure. Your doctor may also give you more specific instructions. Call  your doctor if you have any problems or questions after your procedure. HOME CARE Incision Care  Follow instructions from your doctor about how to take care of your cuts from surgery (incisions). Make sure you:  Wash your hands with soap and water before you change your bandage (dressing). If you cannot use soap and water, use hand sanitizer.  Change your bandage as told by your doctor.  Leave stitches (sutures), skin glue, or skin tape (adhesive) strips in place. They may need to stay in place for 2 weeks or longer. If tape strips get loose and curl up, you may trim the loose edges. Do not remove tape strips completely unless your doctor says it is okay.  Do not take baths, swim, or use a hot tub until your doctor says it is okay. Ask your doctor if you can take showers. You may only be allowed to take sponge baths. General Instructions  Take over-the-counter and prescription medicines only as told by your doctor.  Do not drive or use heavy machinery while taking prescription pain medicine.  Return to your normal diet as told by your doctor.  Do not lift anything that is heavier than 10 lb (4.5 kg).  Do not play contact sports for 1 week or until your doctor says it is okay. GET HELP IF:  You have redness, swelling, or pain at the site of your surgical cuts.  You have fluid, blood, or pus coming from your cuts.  You notice a bad smell coming from your cut area.  Your surgical cuts break open.  You have a fever. GET HELP RIGHT AWAY IF:   You have a rash.  You have trouble breathing.  You have chest pain.  You have pain in your shoulders (shoulder strap areas) that is getting worse.  You pass out (faint) or feel dizzy while you are standing.  You have very bad pain in your belly (abdomen).  You feel sick to your stomach (nauseous) or throw up (vomit) for more than 1 day.   This information is not intended to replace advice given to you by your health care provider.  Make sure you discuss any questions you have with your health care provider.   Document Released: 06/16/2008 Document Revised: 05/29/2015 Document Reviewed: 04/19/2013 Elsevier Interactive Patient Education 2016 Elsevier [employer] General Anesthesia, Adult General anesthesia is a sleep-like state of non-feeling produced by medicines (anesthetics). General anesthesia prevents you from being alert and feeling pain during a medical procedure. Your caregiver may recommend general anesthesia if your procedure:  Is long.  Is painful or uncomfortable.  Would be frightening to see or hear.  Requires you to be still.  Affects your breathing.  Causes significant blood loss. LET YOUR CAREGIVER KNOW ABOUT:  Allergies to food or medicine.  Medicines taken, including vitamins, herbs, eyedrops, over-the-counter medicines, and creams.  Use of steroids (by mouth or creams).  Previous problems with anesthetics or numbing medicines, including problems experienced by relatives.  History of bleeding problems or blood clots.  Previous surgeries and types of anesthetics received.  Possibility of pregnancy, if this applies.  Use of cigarettes, alcohol, or illegal drugs.  Any health condition(s), especially diabetes, sleep apnea, and high blood pressure. RISKS AND COMPLICATIONS General anesthesia rarely causes complications. However, if complications do occur, they can be life threatening. Complications include:  A lung infection.  A stroke.  A heart attack.  Waking up during the procedure. When this occurs, the patient may be unable to move and communicate that he or she is awake. The patient may feel severe pain. Older adults and adults with serious medical problems are more likely to have complications than adults who are young and healthy. Some complications can be prevented by answering all of your caregiver's questions thoroughly and by following all pre-procedure instructions. It is  important to tell your caregiver if any of the pre-procedure instructions, especially those related to diet, were not followed. Any food or liquid in the stomach can cause problems when you are under general anesthesia. BEFORE THE PROCEDURE  Ask your caregiver if you will have to spend the night at the hospital. If you will not have to spend the night, arrange to have an adult drive you and stay with you for 24 hours.  Follow your caregiver's instructions if you are taking dietary supplements or medicines. Your caregiver may tell you to stop taking them or to reduce your dosage.  Do not smoke for as long as possible before your procedure. If possible, stop smoking 3-6 weeks before the procedure.  Do not take new dietary supplements or medicines within 1 week of your  procedure unless your caregiver approves them.  Do not eat within 8 hours of your procedure or as directed by your caregiver. Drink only clear liquids, such as water, black coffee (without milk or cream), and fruit juices (without pulp).  Do not drink within 3 hours of your procedure or as directed by your caregiver.  You may brush your teeth on the morning of the procedure, but make sure to spit out the toothpaste and water when finished. PROCEDURE  You will receive anesthetics through a mask, through an intravenous (IV) access tube, or through both. A doctor who specializes in anesthesia (anesthesiologist) or a nurse who specializes in anesthesia (nurse anesthetist) or both will stay with you throughout the procedure to make sure you remain unconscious. He or she will also watch your blood pressure, pulse, and oxygen levels to make sure that the anesthetics do not cause any problems. Once you are asleep, a breathing tube or mask may be used to help you breathe. AFTER THE PROCEDURE You will wake up after the procedure is complete. You may be in the room where the procedure was performed or in a recovery area. You may have a sore  throat if a breathing tube was used. You may also feel:  Dizzy.  Weak.  Drowsy.  Confused.  Nauseous.  Cold. These are all normal responses and can be expected to last for up to 24 hours after the procedure is complete. A caregiver will tell you when you are ready to go home. This will usually be when you are fully awake and in stable condition.   This information is not intended to replace advice given to you by your health care provider. Make sure you discuss any questions you have with your health care provider.   Document Released: 12/15/2007 Document Revised: 09/28/2014 Document Reviewed: 01/06/2012 Elsevier Interactive Patient Education 2016 Elsevier Inc. General Anesthesia, Adult, Care After Refer to this sheet in the next few weeks. These instructions provide you with information on caring for yourself after your procedure. Your health care provider may also give you more specific instructions. Your treatment has been planned according to current medical practices, but problems sometimes occur. Call your health care provider if you have any problems or questions after your procedure. WHAT TO EXPECT AFTER THE PROCEDURE After the procedure, it is typical to experience:  Sleepiness.  Nausea and vomiting. HOME CARE INSTRUCTIONS  For the first 24 hours after general anesthesia:  Have a responsible person with you.  Do not drive a car. If you are alone, do not take public transportation.  Do not drink alcohol.  Do not take medicine that has not been prescribed by your health care provider.  Do not sign important papers or make important decisions.  You may resume a normal diet and activities as directed by your health care provider.  Change bandages (dressings) as directed.  If you have questions or problems that seem related to general anesthesia, call the hospital and ask for the anesthetist or anesthesiologist on call. SEEK MEDICAL CARE IF:  You have nausea and  vomiting that continue the day after anesthesia.  You develop a rash. SEEK IMMEDIATE MEDICAL CARE IF:   You have difficulty breathing.  You have chest pain.  You have any allergic problems.   This information is not intended to replace advice given to you by your health care provider. Make sure you discuss any questions you have with your health care provider.   Document Released: 12/14/2000 Document  Revised: 09/28/2014 Document Reviewed: 01/06/2012 Elsevier Interactive Patient Education 2016 Elsevier Inc. PATIENT INSTRUCTIONS POST-ANESTHESIA  IMMEDIATELY FOLLOWING SURGERY:  Do not drive or operate machinery for the first twenty four hours after surgery.  Do not make any important decisions for twenty four hours after surgery or while taking narcotic pain medications or sedatives.  If you develop intractable nausea and vomiting or a severe headache please notify your doctor immediately.  FOLLOW-UP:  Please make an appointment with your surgeon as instructed. You do not need to follow up with anesthesia unless specifically instructed to do so.  WOUND CARE INSTRUCTIONS (if applicable):  Keep a dry clean dressing on the anesthesia/puncture wound site if there is drainage.  Once the wound has quit draining you may leave it open to air.  Generally you should leave the bandage intact for twenty four hours unless there is drainage.  If the epidural site drains for more than 36-48 hours please call the anesthesia department.  QUESTIONS?:  Please feel free to call your physician or the hospital operator if you have any questions, and they will be happy to assist you.

## 2016-07-22 ENCOUNTER — Ambulatory Visit (HOSPITAL_COMMUNITY)
Admission: RE | Admit: 2016-07-22 | Discharge: 2016-07-22 | Disposition: A | Discharge status: Home / Self Care | Payer: 59 | Source: Ambulatory Visit | Attending: General Surgery | Admitting: General Surgery

## 2016-07-22 ENCOUNTER — Ambulatory Visit (HOSPITAL_COMMUNITY): Payer: 59 | Admitting: Anesthesiology

## 2016-07-22 ENCOUNTER — Encounter (HOSPITAL_COMMUNITY)
Admission: RE | Disposition: A | Discharge status: Home / Self Care | Payer: Self-pay | Source: Ambulatory Visit | Attending: General Surgery

## 2016-07-22 ENCOUNTER — Encounter (HOSPITAL_COMMUNITY): Payer: Self-pay | Admitting: *Deleted

## 2016-07-22 DIAGNOSIS — K219 Gastro-esophageal reflux disease without esophagitis: Secondary | ICD-10-CM | POA: Diagnosis not present

## 2016-07-22 DIAGNOSIS — Z79899 Other long term (current) drug therapy: Secondary | ICD-10-CM | POA: Diagnosis not present

## 2016-07-22 DIAGNOSIS — R945 Abnormal results of liver function studies: Secondary | ICD-10-CM | POA: Diagnosis not present

## 2016-07-22 DIAGNOSIS — K811 Chronic cholecystitis: Secondary | ICD-10-CM | POA: Diagnosis not present

## 2016-07-22 DIAGNOSIS — K759 Inflammatory liver disease, unspecified: Secondary | ICD-10-CM | POA: Diagnosis not present

## 2016-07-22 DIAGNOSIS — Z793 Long term (current) use of hormonal contraceptives: Secondary | ICD-10-CM | POA: Diagnosis not present

## 2016-07-22 HISTORY — PX: LIVER BIOPSY: SHX301

## 2016-07-22 HISTORY — PX: CHOLECYSTECTOMY: SHX55

## 2016-07-22 SURGERY — LAPAROSCOPIC CHOLECYSTECTOMY
Anesthesia: General

## 2016-07-22 MED ORDER — SCOPOLAMINE 1 MG/3DAYS TD PT72
MEDICATED_PATCH | TRANSDERMAL | Status: AC
  Filled 2016-07-22: qty 1

## 2016-07-22 MED ORDER — PROPOFOL 10 MG/ML IV BOLUS
INTRAVENOUS | Status: DC | PRN
  Administered 2016-07-22: 180 mg via INTRAVENOUS
  Administered 2016-07-22: 20 mg via INTRAVENOUS

## 2016-07-22 MED ORDER — PROMETHAZINE HCL 25 MG/ML IJ SOLN
6.2500 mg | Freq: Once | INTRAMUSCULAR | Status: AC
  Administered 2016-07-22: 6.25 mg via INTRAVENOUS

## 2016-07-22 MED ORDER — SCOPOLAMINE 1 MG/3DAYS TD PT72
1.0000 | MEDICATED_PATCH | Freq: Once | TRANSDERMAL | Status: DC
  Administered 2016-07-22: 1.5 mg via TRANSDERMAL

## 2016-07-22 MED ORDER — GLYCOPYRROLATE 0.2 MG/ML IJ SOLN
INTRAMUSCULAR | Status: AC
  Filled 2016-07-22: qty 2

## 2016-07-22 MED ORDER — FENTANYL CITRATE (PF) 100 MCG/2ML IJ SOLN
INTRAMUSCULAR | Status: DC | PRN
  Administered 2016-07-22: 100 ug via INTRAVENOUS
  Administered 2016-07-22: 25 ug via INTRAVENOUS

## 2016-07-22 MED ORDER — POVIDONE-IODINE 10 % EX OINT
TOPICAL_OINTMENT | CUTANEOUS | Status: AC
Start: 2016-07-22 — End: 2016-07-22
  Filled 2016-07-22: qty 1

## 2016-07-22 MED ORDER — CIPROFLOXACIN IN D5W 400 MG/200ML IV SOLN
400.0000 mg | INTRAVENOUS | Status: AC
  Administered 2016-07-22: 400 mg via INTRAVENOUS

## 2016-07-22 MED ORDER — FENTANYL CITRATE (PF) 100 MCG/2ML IJ SOLN
INTRAMUSCULAR | Status: AC
  Filled 2016-07-22: qty 2

## 2016-07-22 MED ORDER — GLYCOPYRROLATE 0.2 MG/ML IJ SOLN
INTRAMUSCULAR | Status: DC | PRN
  Administered 2016-07-22 (×2): 0.2 mg via INTRAVENOUS

## 2016-07-22 MED ORDER — HYDROMORPHONE HCL 1 MG/ML IJ SOLN: 0.2500 mg | INTRAMUSCULAR | Status: DC | PRN

## 2016-07-22 MED ORDER — FENTANYL CITRATE (PF) 100 MCG/2ML IJ SOLN
25.0000 ug | INTRAMUSCULAR | Status: DC | PRN
  Administered 2016-07-22: 25 ug via INTRAVENOUS

## 2016-07-22 MED ORDER — CHLORHEXIDINE GLUCONATE CLOTH 2 % EX PADS: 6.0000 | MEDICATED_PAD | Freq: Once | CUTANEOUS | Status: DC

## 2016-07-22 MED ORDER — DEXAMETHASONE SODIUM PHOSPHATE 4 MG/ML IJ SOLN
4.0000 mg | Freq: Once | INTRAMUSCULAR | Status: AC
  Administered 2016-07-22: 4 mg via INTRAVENOUS

## 2016-07-22 MED ORDER — CIPROFLOXACIN IN D5W 400 MG/200ML IV SOLN
INTRAVENOUS | Status: AC
  Filled 2016-07-22: qty 200

## 2016-07-22 MED ORDER — LACTATED RINGERS IV SOLN
INTRAVENOUS | Status: DC
  Administered 2016-07-22: 1000 mL via INTRAVENOUS

## 2016-07-22 MED ORDER — ATROPINE SULFATE 0.4 MG/ML IJ SOLN
INTRAMUSCULAR | Status: AC
  Filled 2016-07-22: qty 1

## 2016-07-22 MED ORDER — ONDANSETRON HCL 4 MG/2ML IJ SOLN
4.0000 mg | Freq: Once | INTRAMUSCULAR | Status: AC
  Administered 2016-07-22: 4 mg via INTRAVENOUS

## 2016-07-22 MED ORDER — KETOROLAC TROMETHAMINE 30 MG/ML IJ SOLN
30.0000 mg | Freq: Once | INTRAMUSCULAR | Status: AC
  Administered 2016-07-22: 30 mg via INTRAVENOUS
  Filled 2016-07-22: qty 1

## 2016-07-22 MED ORDER — LIDOCAINE HCL (PF) 1 % IJ SOLN
INTRAMUSCULAR | Status: AC
  Filled 2016-07-22: qty 5

## 2016-07-22 MED ORDER — SODIUM CHLORIDE 0.9% FLUSH
INTRAVENOUS | Status: AC
  Filled 2016-07-22: qty 10

## 2016-07-22 MED ORDER — MIDAZOLAM HCL 2 MG/2ML IJ SOLN
INTRAMUSCULAR | Status: AC
  Filled 2016-07-22: qty 2

## 2016-07-22 MED ORDER — NEOSTIGMINE METHYLSULFATE 10 MG/10ML IV SOLN
INTRAVENOUS | Status: AC
  Filled 2016-07-22: qty 1

## 2016-07-22 MED ORDER — ONDANSETRON HCL 4 MG/2ML IJ SOLN
INTRAMUSCULAR | Status: AC
  Filled 2016-07-22: qty 2

## 2016-07-22 MED ORDER — BUPIVACAINE HCL (PF) 0.5 % IJ SOLN
INTRAMUSCULAR | Status: AC
  Filled 2016-07-22: qty 30

## 2016-07-22 MED ORDER — MIDAZOLAM HCL 2 MG/2ML IJ SOLN
1.0000 mg | INTRAMUSCULAR | Status: DC | PRN
  Administered 2016-07-22: 2 mg via INTRAVENOUS

## 2016-07-22 MED ORDER — DEXAMETHASONE SODIUM PHOSPHATE 4 MG/ML IJ SOLN
INTRAMUSCULAR | Status: AC
  Filled 2016-07-22: qty 1

## 2016-07-22 MED ORDER — ATROPINE SULFATE 0.4 MG/ML IJ SOLN
INTRAMUSCULAR | Status: DC | PRN
  Administered 2016-07-22: 0.2 mg via INTRAVENOUS

## 2016-07-22 MED ORDER — ROCURONIUM BROMIDE 50 MG/5ML IV SOLN
INTRAVENOUS | Status: AC
  Filled 2016-07-22: qty 1

## 2016-07-22 MED ORDER — FENTANYL CITRATE (PF) 250 MCG/5ML IJ SOLN
INTRAMUSCULAR | Status: AC
  Filled 2016-07-22: qty 5

## 2016-07-22 MED ORDER — ROCURONIUM BROMIDE 100 MG/10ML IV SOLN
INTRAVENOUS | Status: DC | PRN
  Administered 2016-07-22: 5 mg via INTRAVENOUS
  Administered 2016-07-22: 25 mg via INTRAVENOUS

## 2016-07-22 MED ORDER — PROMETHAZINE HCL 25 MG/ML IJ SOLN
INTRAMUSCULAR | Status: AC
  Filled 2016-07-22: qty 1

## 2016-07-22 MED ORDER — POVIDONE-IODINE 10 % OINT PACKET
TOPICAL_OINTMENT | CUTANEOUS | Status: DC | PRN
  Administered 2016-07-22: 1 via TOPICAL

## 2016-07-22 MED ORDER — NEOSTIGMINE METHYLSULFATE 10 MG/10ML IV SOLN
INTRAVENOUS | Status: DC | PRN
  Administered 2016-07-22: 3 mg via INTRAVENOUS

## 2016-07-22 MED ORDER — OXYCODONE-ACETAMINOPHEN 7.5-325 MG PO TABS: 1.0000 | ORAL_TABLET | ORAL | 0 refills | Status: DC | PRN

## 2016-07-22 MED ORDER — GLYCOPYRROLATE 0.2 MG/ML IJ SOLN
INTRAMUSCULAR | Status: AC
  Filled 2016-07-22: qty 3

## 2016-07-22 SURGICAL SUPPLY — 58 items
APPLIER CLIP LAPSCP 10X32 DD (CLIP) ×2 IMPLANT
BAG HAMPER (MISCELLANEOUS) ×2 IMPLANT
BAG SPEC RTRVL LRG 6X4 10 (ENDOMECHANICALS) ×1
CHLORAPREP W/TINT 26ML (MISCELLANEOUS) ×2 IMPLANT
CLOTH BEACON ORANGE TIMEOUT ST (SAFETY) ×2 IMPLANT
COVER LIGHT HANDLE STERIS (MISCELLANEOUS) ×4 IMPLANT
DECANTER SPIKE VIAL GLASS SM (MISCELLANEOUS) ×2 IMPLANT
DURAPREP 26ML APPLICATOR (WOUND CARE) ×2 IMPLANT
ELECT REM PT RETURN 9FT ADLT (ELECTROSURGICAL) ×2
ELECTRODE REM PT RTRN 9FT ADLT (ELECTROSURGICAL) ×1 IMPLANT
FILTER SMOKE EVAC LAPAROSHD (FILTER) ×2 IMPLANT
FORMALIN 10 PREFIL 120ML (MISCELLANEOUS) ×2 IMPLANT
GAUZE SPONGE 2X2 8PLY STRL LF (GAUZE/BANDAGES/DRESSINGS) IMPLANT
GLOVE BIOGEL PI IND STRL 7.0 (GLOVE) ×1 IMPLANT
GLOVE BIOGEL PI INDICATOR 7.0 (GLOVE) ×1
GLOVE SURG SS PI 7.5 STRL IVOR (GLOVE) ×4 IMPLANT
GOWN STRL REUS W/ TWL XL LVL3 (GOWN DISPOSABLE) ×1 IMPLANT
GOWN STRL REUS W/TWL LRG LVL3 (GOWN DISPOSABLE) ×6 IMPLANT
GOWN STRL REUS W/TWL XL LVL3 (GOWN DISPOSABLE) ×2
HEMOSTAT SNOW SURGICEL 2X4 (HEMOSTASIS) ×2 IMPLANT
INST SET LAPROSCOPIC AP (KITS) ×2 IMPLANT
IV NS IRRIG 3000ML ARTHROMATIC (IV SOLUTION) IMPLANT
KIT ROOM TURNOVER APOR (KITS) ×2 IMPLANT
MANIFOLD NEPTUNE II (INSTRUMENTS) ×2 IMPLANT
NDL BIOPSY 14GX4.5 SOFT TIS (NEEDLE) IMPLANT
NDL BIOPSY 14X6 SOFT TISS (NEEDLE) IMPLANT
NDL HYPO 18GX1.5 BLUNT FILL (NEEDLE) IMPLANT
NDL HYPO 25X1 1.5 SAFETY (NEEDLE) ×1 IMPLANT
NDL INSUFFLATION 14GA 120MM (NEEDLE) ×1 IMPLANT
NEEDLE BIOPSY 14GX4.5 SOFT TIS (NEEDLE) IMPLANT
NEEDLE BIOPSY 14X6 SOFT TISS (NEEDLE) ×2 IMPLANT
NEEDLE HYPO 18GX1.5 BLUNT FILL (NEEDLE) IMPLANT
NEEDLE HYPO 25X1 1.5 SAFETY (NEEDLE) ×2 IMPLANT
NEEDLE INSUFFLATION 14GA 120MM (NEEDLE) ×2 IMPLANT
NS IRRIG 1000ML POUR BTL (IV SOLUTION) ×2 IMPLANT
PACK LAP CHOLE LZT030E (CUSTOM PROCEDURE TRAY) ×2 IMPLANT
PACK MINOR (CUSTOM PROCEDURE TRAY) ×2 IMPLANT
PAD ARMBOARD 7.5X6 YLW CONV (MISCELLANEOUS) ×2 IMPLANT
PAD TELFA 3X4 1S STER (GAUZE/BANDAGES/DRESSINGS) ×2 IMPLANT
POUCH SPECIMEN RETRIEVAL 10MM (ENDOMECHANICALS) ×2 IMPLANT
SET BASIN LINEN APH (SET/KITS/TRAYS/PACK) ×2 IMPLANT
SET TUBE IRRIG SUCTION NO TIP (IRRIGATION / IRRIGATOR) IMPLANT
SLEEVE ENDOPATH XCEL 5M (ENDOMECHANICALS) ×2 IMPLANT
SPONGE GAUZE 2X2 8PLY STRL LF (GAUZE/BANDAGES/DRESSINGS) ×8 IMPLANT
SPONGE GAUZE 2X2 STER 10/PKG (GAUZE/BANDAGES/DRESSINGS) ×1
STAPLER VISISTAT (STAPLE) ×2 IMPLANT
STRIP CLOSURE SKIN 1/4X3 (GAUZE/BANDAGES/DRESSINGS) ×2 IMPLANT
SUT VIC AB 4-0 PS2 27 (SUTURE) ×2 IMPLANT
SUT VICRYL 0 UR6 27IN ABS (SUTURE) ×2 IMPLANT
SYRINGE 10CC LL (SYRINGE) ×2 IMPLANT
TAPE CLOTH SURG 6X10 WHT LF (GAUZE/BANDAGES/DRESSINGS) ×1 IMPLANT
TOWEL OR 17X26 4PK STRL BLUE (TOWEL DISPOSABLE) ×2 IMPLANT
TROCAR ENDO BLADELESS 11MM (ENDOMECHANICALS) ×2 IMPLANT
TROCAR XCEL NON-BLD 5MMX100MML (ENDOMECHANICALS) ×2 IMPLANT
TROCAR XCEL UNIV SLVE 11M 100M (ENDOMECHANICALS) ×2 IMPLANT
TUBE CONNECTING 12X1/4 (SUCTIONS) ×2 IMPLANT
TUBING INSUFFLATION (TUBING) ×2 IMPLANT
WARMER LAPAROSCOPE (MISCELLANEOUS) ×2 IMPLANT

## 2016-07-22 NOTE — Op Note (Signed)
Patient:  Heather BubaJennifer M [name]  DOB:  1981-03-19  MRN:  [id number]   Preop Diagnosis:  Chronic cholecystitis, elevated liver enzyme tests  Postop Diagnosis:  Same  Procedure:  Laparoscopic cholecystectomy, liver biopsy  Surgeon:  Franky MachoMark Charlot Gouin, M.D.  Assistant: Satira MccallumJason Davis, M.D.  Anes:  Gen. endotracheal  Indications:  Patient is a 35 year old white female who presents with biliary colic secondary to chronic cholecystitis as well as elevated liver enzyme tests. The risks and benefits of the procedure including bleeding, infection, hepatobiliary injury, and the possibility of an open procedure were fully explained to the patient, who gave informed consent.  Procedure note:  The patient was placed the supine position. After induction of general endotracheal anesthesia, the abdomen was prepped and draped using the usual sterile technique with DuraPrep. Surgical site confirmation was performed.  An infraumbilical incision was made down to the fascia. A Veress needle was introduced into the abdominal cavity and confirmation of placement was done using the saline drop test. The abdomen was then insufflated to 16 mmHg pressure. An 11 mm trocar was introduced into the abdominal cavity under direct visualization without difficulty. The patient was placed in reverse Trendelenburg position and an additional 11 mm trocar was placed the epigastric region and 5 mm trochars were placed the right upper quadrant and right flank regions. Liver was inspected and appeared to be within normal limits. 2 Tru-Cut needle biopsies of the right lobe of liver were performed at sent to pathology further examination. The puncture site in the liver was cauterized using Bovie electrocautery. The gallbladder was retracted in a dynamic fashion in order to provide a critical view of the triangle of Calot. The cystic duct was first identified. Its juncture to the infundibulum was fully identified. Endoclips placed proximally  distally on the cystic duct, and the cystic duct was divided. This was likewise done cystic artery. The gallbladder was freed away from the gallbladder fossa using Bovie electrocautery. The gallbladder was delivered to the epigastric trocar site using an Endo Catch bag. The gallbladder fossa was inspected and no abnormal bleeding or bile leakage was noted. Surgicel was placed the gallbladder fossa. All fluid and air were then evacuated from the abdominal cavity prior to removal of the trochars.  All wounds were irrigated with normal saline. The infraumbilical fascia was reapproximated using an 0 Vicryl interrupted suture. All skin incisions were closed using staples. Betadine ointment and dry sterile dressings were applied.  All tape and needle counts were correct at the end of the procedure. Patient was extubated in the operating room and transferred to PACU in stable condition.  Complications:  None  EBL:  Minimal  Specimen:  Gallbladder, liver biopsy

## 2016-07-22 NOTE — Anesthesia Postprocedure Evaluation (Signed)
Anesthesia Post Note  Patient: Heather [name]  Procedure(s) Performed: Procedure(s) (LRB): LAPAROSCOPIC CHOLECYSTECTOMY (N/A) LIVER BIOPSY (N/A)  Patient location during evaluation: PACU Anesthesia Type: General Level of consciousness: awake Pain management: satisfactory to patient Vital Signs Assessment: post-procedure vital signs reviewed and stable Respiratory status: spontaneous breathing Cardiovascular status: stable Anesthetic complications: no    Last Vitals:  Vitals:   07/22/16 0900 07/22/16 0905  BP: 116/61   Pulse: 65 87  Resp: 14 16  Temp:      Last Pain:  Vitals:   07/22/16 0905  TempSrc:   PainSc: 3                  Camil Hausmann

## 2016-07-22 NOTE — Anesthesia Preprocedure Evaluation (Signed)
Anesthesia Evaluation  Patient identified by MRN, date of birth, ID band Patient awake    History of Anesthesia Complications (+) PONV and history of anesthetic complications  Airway Mallampati: I  TM Distance: >3 FB     Dental  (+) Teeth Intact, Dental Advisory Given   Pulmonary    breath sounds clear to auscultation       Cardiovascular negative cardio ROS   Rhythm:Regular Rate:Normal     Neuro/Psych Anxiety    GI/Hepatic GERD  Medicated,  Endo/Other    Renal/GU      Musculoskeletal   Abdominal   Peds  Hematology   Anesthesia Other Findings   Reproductive/Obstetrics                             Anesthesia Physical Anesthesia Plan  ASA: II  Anesthesia Plan: General   Post-op Pain Management:    Induction: Intravenous, Rapid sequence and Cricoid pressure planned  Airway Management Planned: Oral ETT  Additional Equipment:   Intra-op Plan:   Post-operative Plan: Extubation in OR  Informed Consent: I have reviewed the patients History and Physical, chart, labs and discussed the procedure including the risks, benefits and alternatives for the proposed anesthesia with the patient or authorized representative who has indicated his/her understanding and acceptance.     Plan Discussed with:   Anesthesia Plan Comments:         Anesthesia Quick Evaluation

## 2016-07-22 NOTE — Anesthesia Procedure Notes (Signed)
Procedure Name: Intubation Date/Time: 07/22/2016 7:46 AM Performed by: Franco NonesYATES, Dorenda Pfannenstiel S Pre-anesthesia Checklist: Patient identified, Patient being monitored, Timeout performed, Emergency Drugs available and Suction available Patient Re-evaluated:Patient Re-evaluated prior to inductionOxygen Delivery Method: Circle System Utilized Preoxygenation: Pre-oxygenation with 100% oxygen Intubation Type: IV induction, Rapid sequence and Cricoid Pressure applied Ventilation: Mask ventilation without difficulty Laryngoscope Size: Miller and 2 Grade View: Grade I Tube type: Oral Tube size: 7.0 mm Number of attempts: 1 Airway Equipment and Method: Stylet Placement Confirmation: ETT inserted through vocal cords under direct vision,  positive ETCO2 and breath sounds checked- equal and bilateral Secured at: 21 cm Tube secured with: Tape Dental Injury: Teeth and Oropharynx as per pre-operative assessment

## 2016-07-22 NOTE — Transfer of Care (Signed)
Immediate Anesthesia Transfer of Care Note  Patient: Heather [name]  Procedure(s) Performed: Procedure(s): LAPAROSCOPIC CHOLECYSTECTOMY (N/A) LIVER BIOPSY (N/A)  Patient Location: PACU  Anesthesia Type:General  Level of Consciousness: sedated and patient cooperative  Airway & Oxygen Therapy: Patient Spontanous Breathing and non-rebreather face mask  Post-op Assessment: Report given to RN and Post -op Vital signs reviewed and stable  Post vital signs: Reviewed and stable  Last Vitals:  Vitals:   07/22/16 0725 07/22/16 0730  BP: 119/61 110/63  Resp: (!) 27 17  Temp:      Last Pain:  Vitals:   07/22/16 0645  TempSrc: Oral      Patients Stated Pain Goal: 7 (07/22/16 0645)  Complications: No apparent anesthesia complications

## 2016-07-22 NOTE — Discharge Instructions (Signed)
Laparoscopic Cholecystectomy, Care After °Refer to this sheet in the next few weeks. These instructions provide you with information about caring for yourself after your procedure. Your health care provider may also give you more specific instructions. Your treatment has been planned according to current medical practices, but problems sometimes occur. Call your health care provider if you have any problems or questions after your procedure. °WHAT TO EXPECT AFTER THE PROCEDURE °After your procedure, it is common to have: °· Pain at your incision sites. You will be given pain medicines to control your pain. °· Mild nausea or vomiting. This should improve after the first 24 hours. °· Bloating and possible shoulder pain from the gas that was used during the procedure. This will improve after the first 24 hours. °HOME CARE INSTRUCTIONS °Incision Care °· Follow instructions from your health care provider about how to take care of your incisions. Make sure you: °¨ Wash your hands with soap and water before you change your bandage (dressing). If soap and water are not available, use hand sanitizer. °¨ Change your dressing as told by your health care provider. °¨ Leave stitches (sutures), skin glue, or adhesive strips in place. These skin closures may need to be in place for 2 weeks or longer. If adhesive strip edges start to loosen and curl up, you may trim the loose edges. Do not remove adhesive strips completely unless your health care provider tells you to do that. °· Do not take baths, swim, or use a hot tub until your health care provider approves. Ask your health care provider if you can take showers. You may only be allowed to take sponge baths for bathing. °General Instructions °· Take over-the-counter and prescription medicines only as told by your health care provider. °· Do not drive or operate heavy machinery while taking prescription pain medicine. °· Return to your normal diet as told by your health care  provider. °· Do not lift anything that is heavier than 10 lb (4.5 kg). °· Do not play contact sports for one week or until your health care provider approves. °SEEK MEDICAL CARE IF:  °· You have redness, swelling, or pain at the site of your incision. °· You have fluid, blood, or pus coming from your incision. °· You notice a bad smell coming from your incision area. °· Your surgical incisions break open. °· You have a fever. °SEEK IMMEDIATE MEDICAL CARE IF: °· You develop a rash. °· You have difficulty breathing. °· You have chest pain. °· You have increasing pain in your shoulders (shoulder strap areas). °· You faint or have dizzy episodes while you are standing. °· You have severe pain in your abdomen. °· You have nausea or vomiting that lasts for more than one day. °  °This information is not intended to replace advice given to you by your health care provider. Make sure you discuss any questions you have with your health care provider. °  °Document Released: 09/07/2005 Document Revised: 05/29/2015 Document Reviewed: 04/19/2013 °Elsevier Interactive Patient Education ©2016 Elsevier Inc. ° °

## 2016-07-22 NOTE — Interval H&P Note (Signed)
History and Physical Interval Note:  07/22/2016 7:26 AM  Heather BubaJennifer M McMillan  has presented today for surgery, with the diagnosis of chronic cholecystitis, elevated liver enzymes  The various methods of treatment have been discussed with the patient and family. After consideration of risks, benefits and other options for treatment, the patient has consented to  Procedure(s): LAPAROSCOPIC CHOLECYSTECTOMY (N/A) LIVER BIOPSY (N/A) as a surgical intervention .  The patient's history has been reviewed, patient examined, no change in status, stable for surgery.  I have reviewed the patient's chart and labs.  Questions were answered to the patient's satisfaction.     [name],[name]

## 2016-07-24 ENCOUNTER — Encounter (HOSPITAL_COMMUNITY): Payer: Self-pay | Admitting: General Surgery

## 2016-07-28 ENCOUNTER — Telehealth: Payer: Self-pay | Admitting: Gastroenterology

## 2016-07-28 NOTE — Telephone Encounter (Signed)
Reviewed liver biopsy. Showed mild lobular hepatitis. Copied/pasted further explanation from path below:   The core biopsy shows normal portal areas and hepatic lobules. There are occasional small foci of lobularinflammation with a few mildly degenerated hepatocytes. The pattern is nonspecific and possibilities include drugs.Non-hepatotropic viruses such as cytomegalovirus or Epstein-Barr virus are also possibilities but considered less likely. Trichrome and reticulin stains show no increased fibrosis and there is no increased iron with iron stain and no alpha-1 anti-trypsin deposits are identified with PAS stain.  Used to take tylenol a lot prior to taking Ibuprofen. On Celexa. OTC agents include Biotin and Vit D. Will recheck HFP in 2 weeks.    Please send orders to [facility] for HFP. Patient is aware of findings.

## 2016-07-30 ENCOUNTER — Other Ambulatory Visit: Payer: Self-pay

## 2016-07-30 DIAGNOSIS — R945 Abnormal results of liver function studies: Principal | ICD-10-CM

## 2016-07-30 DIAGNOSIS — R7989 Other specified abnormal findings of blood chemistry: Secondary | ICD-10-CM

## 2016-07-30 NOTE — Telephone Encounter (Signed)
Order has been entered and released for the HFP in 2 weeks.

## 2016-08-03 ENCOUNTER — Encounter (INDEPENDENT_AMBULATORY_CARE_PROVIDER_SITE_OTHER): Payer: Self-pay | Admitting: Internal Medicine

## 2016-08-03 ENCOUNTER — Ambulatory Visit (INDEPENDENT_AMBULATORY_CARE_PROVIDER_SITE_OTHER): Payer: 59 | Admitting: Internal Medicine

## 2016-08-06 ENCOUNTER — Other Ambulatory Visit (HOSPITAL_COMMUNITY)
Admission: RE | Admit: 2016-08-06 | Discharge: 2016-08-06 | Disposition: A | Discharge status: Home / Self Care | Payer: 59 | Source: Ambulatory Visit | Attending: Obstetrics & Gynecology | Admitting: Obstetrics & Gynecology

## 2016-08-06 ENCOUNTER — Encounter: Payer: Self-pay | Admitting: Obstetrics & Gynecology

## 2016-08-06 ENCOUNTER — Ambulatory Visit (INDEPENDENT_AMBULATORY_CARE_PROVIDER_SITE_OTHER): Payer: Commercial Managed Care - PPO | Admitting: Obstetrics & Gynecology

## 2016-08-06 VITALS — BP 110/70 | HR 72 | Ht 65.0 in | Wt 180.0 lb

## 2016-08-06 DIAGNOSIS — Z01419 Encounter for gynecological examination (general) (routine) without abnormal findings: Secondary | ICD-10-CM | POA: Insufficient documentation

## 2016-08-06 DIAGNOSIS — Z7251 High risk heterosexual behavior: Secondary | ICD-10-CM

## 2016-08-06 DIAGNOSIS — Z113 Encounter for screening for infections with a predominantly sexual mode of transmission: Secondary | ICD-10-CM | POA: Insufficient documentation

## 2016-08-06 NOTE — Addendum Note (Signed)
Addended by: Federico FlakeNES, Jaxsun Ciampi A on: 08/06/2016 04:38 PM   Modules accepted: Orders

## 2016-08-06 NOTE — Progress Notes (Signed)
Subjective:     Heather [name] is a 35 y.o. female here for a routine exam.  Patient's last menstrual period was 11/05/2015 (within weeks). [id number] Birth Control Method:  Mirena Menstrual Calendar(currently): amenorrheic on mirena  Current complaints: none. Wants STD check  Current acute medical issues:  Cholecystectomy 3 weeks ago   Recent Gynecologic History Patient's last menstrual period was 11/05/2015 (within weeks). Last Pap: 5/17 ,  normal Last mammogram: na,    Past Medical History:  Diagnosis Date  . Abnormal Pap smear   . Adenotonsillar hypertrophy    denies snoring during sleep or apnea  . Anxiety   . Complication of anesthesia    PT doesnt want any heavy narcotics  . GERD (gastroesophageal reflux disease)   . HPV (human papilloma virus) infection   . IUD (intrauterine device) in place   . Palpitations   . Panic attack   . RUQ pain 01/02/2016  . Strep throat    to start antibiotic 08/20/2014    Past Surgical History:  Procedure Laterality Date  . CERVICAL CONIZATION W/BX N/A 08/27/2014   Procedure: CONIZATION CERVIX ;  Surgeon: Lazaro Arms, MD;  Location: [facility];  Service: Gynecology;  Laterality: N/A;  Laser conization and ablation of cervix  . CHOLECYSTECTOMY N/A 07/22/2016   Procedure: LAPAROSCOPIC CHOLECYSTECTOMY;  Surgeon: Franky Macho, MD;  Location: [facility];  Service: General;  Laterality: N/A;  . CO2 LASER APPLICATION N/A 08/27/2014   Procedure: CO2 LASER APPLICATION;  Surgeon: Lazaro Arms, MD;  Location: [facility];  Service: Gynecology;  Laterality: N/A;  . LIVER BIOPSY N/A 07/22/2016   Procedure: LIVER BIOPSY;  Surgeon: Franky Macho, MD;  Location: [facility];  Service: General;  Laterality: N/A;  . TONSILLECTOMY AND ADENOIDECTOMY Bilateral 08/27/2014   Procedure: BILATERAL TONSILLECTOMY AND ADENOIDECTOMY;  Surgeon: Darletta Moll, MD;  Location: [facility];  Service: ENT;  Laterality: Bilateral;  .  TYMPANOSTOMY TUBE PLACEMENT Bilateral     OB History    Gravida Para Term Preterm AB Living   3 3 2     3    SAB TAB Ectopic Multiple Live Births           2      Social History   Social History  . Marital status: Married    Spouse name: N/A  . Number of children: 3  . Years of education: N/A   Social History Main Topics  . Smoking status: Never Smoker  . Smokeless tobacco: Never Used  . Alcohol use No  . Drug use: No  . Sexual activity: Yes    Birth control/ protection: IUD   Other Topics Concern  . None   Social History Narrative  . None    Family History  Problem Relation Age of Onset  . Hypertension Mother   . Heart disease Maternal Grandmother     pacemaker  . Gallbladder disease Maternal Grandmother   . Hypertension Father   . Heart disease Father     pacemaker  . Cancer Paternal Grandfather     colon cancer  . Stroke Paternal Grandfather   . Macular degeneration Paternal Grandmother   . Leukemia Son   . Asthma Son   . Colon cancer Neg Hx   . Liver disease Neg Hx      Current Outpatient Prescriptions:  .  Biotin 1000 MCG tablet, Take 1,000 mcg by mouth daily., Disp: , Rfl:  .  calcium carbonate (TUMS  EX) 750 MG chewable tablet, Chew 1 tablet by mouth daily as needed. , Disp: , Rfl:  .  cetirizine (ZYRTEC) 10 MG tablet, Take 10 mg by mouth daily., Disp: , Rfl:  .  cholecalciferol (VITAMIN D) 1000 units tablet, Take 1,000 Units by mouth daily., Disp: , Rfl:  .  citalopram (CELEXA) 20 MG tablet, Take 1/2 tablet daaily (Patient taking differently: Take 1/2 tablet daily at bedtime), Disp: 15 tablet, Rfl: 11 .  ibuprofen (ADVIL,MOTRIN) 800 MG tablet, Take 1 tablet (800 mg total) by mouth 3 (three) times daily. (Patient taking differently: Take 800 mg by mouth 2 (two) times daily as needed for moderate pain. ), Disp: 21 tablet, Rfl: 0 .  levonorgestrel (MIRENA) 20 MCG/24HR IUD, 1 each by Intrauterine route once., Disp: , Rfl:   Review of Systems  Review  of Systems  Constitutional: Negative for fever, chills, weight loss, malaise/fatigue and diaphoresis.  HENT: Negative for hearing loss, ear pain, nosebleeds, congestion, sore throat, neck pain, tinnitus and ear discharge.   Eyes: Negative for blurred vision, double vision, photophobia, pain, discharge and redness.  Respiratory: Negative for cough, hemoptysis, sputum production, shortness of breath, wheezing and stridor.   Cardiovascular: Negative for chest pain, palpitations, orthopnea, claudication, leg swelling and PND.  Gastrointestinal: negative for abdominal pain. Negative for heartburn, nausea, vomiting, diarrhea, constipation, blood in stool and melena.  Genitourinary: Negative for dysuria, urgency, frequency, hematuria and flank pain.  Musculoskeletal: Negative for myalgias, back pain, joint pain and falls.  Skin: Negative for itching and rash.  Neurological: Negative for dizziness, tingling, tremors, sensory change, speech change, focal weakness, seizures, loss of consciousness, weakness and headaches.  Endo/Heme/Allergies: Negative for environmental allergies and polydipsia. Does not bruise/bleed easily.  Psychiatric/Behavioral: Negative for depression, suicidal ideas, hallucinations, memory loss and substance abuse. The patient is not nervous/anxious and does not have insomnia.        Objective:  Blood pressure 110/70, pulse 72, height 5\' 5"  (1.651 m), weight 180 lb (81.6 kg), last menstrual period 11/05/2015.   Physical Exam  Vitals reviewed. Constitutional: She is oriented to person, place, and time. She appears well-developed and well-nourished.  HENT:  Head: Normocephalic and atraumatic.        Right Ear: External ear normal.  Left Ear: External ear normal.  Nose: Nose normal.  Mouth/Throat: Oropharynx is clear and moist.  Eyes: Conjunctivae and EOM are normal. Pupils are equal, round, and reactive to light. Right eye exhibits no discharge. Left eye exhibits no discharge. No  scleral icterus.  Neck: Normal range of motion. Neck supple. No tracheal deviation present. No thyromegaly present.  Cardiovascular: Normal rate, regular rhythm, normal heart sounds and intact distal pulses.  Exam reveals no gallop and no friction rub.   No murmur heard. Respiratory: Effort normal and breath sounds normal. No respiratory distress. She has no wheezes. She has no rales. She exhibits no tenderness.  GI: Soft. Bowel sounds are normal. She exhibits no distension and no mass. There is no tenderness. There is no rebound and no guarding.  Genitourinary:  Breasts no masses skin changes or nipple changes bilaterally      Vulva is normal without lesions Vagina is pink moist without discharge Cervix normal in appearance and pap is done Uterus is normal size shape and contour Adnexa is negative with normal sized ovaries   Musculoskeletal: Normal range of motion. She exhibits no edema and no tenderness.  Neurological: She is alert and oriented to person, place, and time. She has  normal reflexes. She displays normal reflexes. No cranial nerve deficit. She exhibits normal muscle tone. Coordination normal.  Skin: Skin is warm and dry. No rash noted. No erythema. No pallor.  Psychiatric: She has a normal mood and affect. Her behavior is normal. Judgment and thought content normal.       Medications Ordered at today's visit: No orders of the defined types were placed in this encounter.   Other orders placed at today's visit: Orders Placed This Encounter  Procedures  . HIV antibody  . RPR      Assessment:    Healthy female exam.    Plan:    Contraception: IUD. Follow up in: 1 year.     No Follow-up on file.

## 2016-08-08 LAB — HIV ANTIBODY (ROUTINE TESTING W REFLEX): HIV Screen 4th Generation wRfx: NONREACTIVE

## 2016-08-08 LAB — RPR: RPR Ser Ql: NONREACTIVE

## 2016-08-10 ENCOUNTER — Encounter: Payer: Self-pay | Admitting: Obstetrics & Gynecology

## 2016-08-10 LAB — CYTOLOGY - PAP
ADEQUACY: ABSENT
Chlamydia: NEGATIVE
Diagnosis: NEGATIVE
NEISSERIA GONORRHEA: NEGATIVE

## 2016-08-11 ENCOUNTER — Encounter: Payer: Self-pay | Admitting: Obstetrics & Gynecology

## 2016-08-25 ENCOUNTER — Encounter: Payer: Self-pay | Admitting: Cardiology

## 2016-08-25 ENCOUNTER — Ambulatory Visit (INDEPENDENT_AMBULATORY_CARE_PROVIDER_SITE_OTHER): Payer: Commercial Managed Care - PPO | Admitting: Cardiology

## 2016-08-25 VITALS — BP 104/78 | HR 85 | Ht 65.0 in | Wt 181.0 lb

## 2016-08-25 DIAGNOSIS — R072 Precordial pain: Secondary | ICD-10-CM | POA: Diagnosis not present

## 2016-08-25 DIAGNOSIS — R7989 Other specified abnormal findings of blood chemistry: Secondary | ICD-10-CM | POA: Diagnosis not present

## 2016-08-25 LAB — HEPATIC FUNCTION PANEL
ALBUMIN: 4.3 g/dL (ref 3.6–5.1)
ALT: 51 U/L — ABNORMAL HIGH (ref 6–29)
AST: 22 U/L (ref 10–30)
Alkaline Phosphatase: 96 U/L (ref 33–115)
BILIRUBIN TOTAL: 0.8 mg/dL (ref 0.2–1.2)
Bilirubin, Direct: 0.2 mg/dL (ref <=0.2)
Indirect Bilirubin: 0.6 mg/dL (ref 0.2–1.2)
Total Protein: 6.9 g/dL (ref 6.1–8.1)

## 2016-08-25 NOTE — Patient Instructions (Addendum)
Your physician recommends that you schedule a follow-up appointment As Needed  Your physician recommends that you continue on your current medications as directed. Please refer to the Current Medication list given to you today.  Your physician has requested that you have an echocardiogram. Echocardiography is a painless test that uses sound waves to create images of your heart. It provides your doctor with information about the size and shape of your heart and how well your heart's chambers and valves are working. This procedure takes approximately one hour. There are no restrictions for this procedure.   If you need a refill on your cardiac medications before your next appointment, please call your pharmacy.  Thank you for choosing [facility]!    

## 2016-08-25 NOTE — Progress Notes (Signed)
08/25/2016 Heather [name]   1981-05-20  [id number]  Primary Physician Wilson SingerGOSRANI,NIMISH C, MD Primary Cardiologist: Dr Purvis SheffieldKoneswaran  HPI:  35 y/o female we saw in the past for palpitations and chest pressure. Prior echo March 2017 was normal.  [place] with "[place]izz[place]". She says her D dimer was elevated"6".  She denied any specific chest pain, hemoptysis, or dyspnea. CTA of her chest and a headCT were done and both were normal per the pt. She is here now for evaluation of her elevated D dimer. She has no history of blood clots. She tell me her grandmother did have a PE.  The pt has vague chest discomfort, not exertional, no radiation to her jaw or arms.    Current Outpatient Prescriptions  Medication Sig Dispense Refill  . calcium carbonate (TUMS EX) 750 MG chewable tablet Chew 1 tablet by mouth daily as needed.     . cetirizine (ZYRTEC) 10 MG tablet Take 10 mg by mouth daily.    . cholecalciferol (VITAMIN D) 1000 units tablet Take 1,000 Units by mouth daily.    . citalopram (CELEXA) 20 MG tablet Take 1/2 tablet daaily (Patient taking differently: Take 1/2 tablet daily at bedtime) 15 tablet 11  . ibuprofen (ADVIL,MOTRIN) 800 MG tablet Take 1 tablet (800 mg total) by mouth 3 (three) times daily. (Patient taking differently: Take 800 mg by mouth 2 (two) times daily as needed for moderate pain. ) 21 tablet 0  . levonorgestrel (MIRENA) 20 MCG/24HR IUD 1 each by Intrauterine route once.    . ondansetron (ZOFRAN-ODT) 4 MG disintegrating tablet Take 4 mg by mouth every 8 (eight) hours as needed.      No current facility-administered medications for this visit.     Allergies  Allergen Reactions  . Epinephrine Other (See Comments)    DUE TO PALPITATIONS  . Lidocaine Other (See Comments)    TACHYCARDIA  . Penicillins Hives    AS A CHILD - STATES CAN TAKE AMOXICILLIN Has patient had a PCN reaction causing immediate rash, facial/tongue/throat swelling, SOB  or lightheadedness with hypotension: Yes Has patient had a PCN reaction causing severe rash involving mucus membranes or skin necrosis: Yes Has patient had a PCN reaction that required hospitalization Unknown Has patient had a PCN reaction occurring within the last 10 years: No If all of the above answers are "NO", then may proceed with Cephalosporin use.     Social History   Social History  . Marital status: Married    Spouse name: N/A  . Number of children: 3  . Years of education: N/A   Occupational History  . Not on file.   Social History Main Topics  . Smoking status: Never Smoker  . Smokeless tobacco: Never Used  . Alcohol use No  . Drug use: No  . Sexual activity: Yes    Birth control/ protection: IUD   Other Topics Concern  . Not on file   Social History Narrative  . No narrative on file     Review of Systems: General: negative for chills, fever, night sweats or weight changes.  Cardiovascular: negative for chest pain, dyspnea on exertion, edema, orthopnea, palpitations, paroxysmal nocturnal dyspnea or shortness of breath Dermatological: negative for rash Respiratory: negative for cough or wheezing Urologic: negative for hematuria Abdominal: negative for nausea, vomiting, diarrhea, bright red blood per rectum, melena, or hematemesis Neurologic: negative for visual changes, syncope, or dizziness All other systems reviewed and  are otherwise negative except as noted above.    Blood pressure 104/78, pulse 85, height 5\' 5"  (1.651 m), weight 181 lb (82.1 kg), SpO2 98 %.  General appearance: alert, cooperative, no distress and mildly obese Neck: no carotid bruit and no JVD Lungs: clear to auscultation bilaterally Heart: regular rate and rhythm Abdomen: RUQ surgical scar, non tender, not distended Extremities: extremities normal, atraumatic, no cyanosis or edema Pulses: 2+ and symmetric Skin: Skin color, texture, turgor normal. No rashes or lesions Neurologic:  Grossly normal  EKG NSR  ASSESSMENT AND PLAN:   Positive D dimer Pt seen recently at an outside hospital [facility]([place] with dizziness, D Dimer reportedly "6". CTA negative.   PLAN  Discussed with Dr Diona BrownerMcDowell, check an echo to r/o new WMA or LVT- if negative then PRN f/u with us. She should keep her appointment with her PCP as scheduled.   Corine ShelterLuke Tylique Aull PA-C 08/25/2016 12:59 PM

## 2016-08-25 NOTE — Assessment & Plan Note (Signed)
Pt seen recently at an outside hospital [facility]([place] [place]) with dizziness, D Dimer reportedly "6". CTA negative.

## 2016-08-27 ENCOUNTER — Ambulatory Visit (HOSPITAL_COMMUNITY)
Admission: RE | Admit: 2016-08-27 | Discharge: 2016-08-27 | Disposition: A | Discharge status: Home / Self Care | Payer: Commercial Managed Care - PPO | Source: Ambulatory Visit | Attending: Cardiology | Admitting: Cardiology

## 2016-08-27 DIAGNOSIS — R072 Precordial pain: Secondary | ICD-10-CM | POA: Diagnosis not present

## 2016-08-27 DIAGNOSIS — R079 Chest pain, unspecified: Secondary | ICD-10-CM | POA: Diagnosis present

## 2016-08-27 LAB — ECHOCARDIOGRAM COMPLETE
E decel time: 278 msec
E/e' ratio: 5.25
FS: 38 % (ref 28–44)
IVS/LV PW RATIO, ED: 0.94
LA ID, A-P, ES: 35 mm
LA diam end sys: 35 mm
LA diam index: 1.78 cm/m2
LA vol A4C: 30.4 ml
LA vol index: 18 mL/m2
LA vol: 35.3 mL
LV E/e' medial: 5.25
LV E/e'average: 5.25
LV PW d: 9.02 mm — AB (ref 0.6–1.1)
LV dias vol index: 26 mL/m2
LV dias vol: 51 mL (ref 46–106)
LV e' LATERAL: 15.6 cm/s
LV sys vol index: 8 mL/m2
LV sys vol: 16 mL (ref 14–42)
LVOT SV: 59 mL
LVOT VTI: 23.2 cm
LVOT area: 2.54 cm2
LVOT diameter: 18 mm
LVOT peak grad rest: 5 mmHg
LVOT peak vel: 109 cm/s
Lateral S' vel: 12.2 cm/s
MV Dec: 278
MV Peak grad: 3 mmHg
MV pk A vel: 46.6 m/s
MV pk E vel: 81.9 m/s
RV sys press: 16 mmHg
Reg peak vel: 180 cm/s
Simpson's disk: 68
Stroke v: 35 ml
TAPSE: 20.4 mm
TDI e' lateral: 15.6
TDI e' medial: 10
TR max vel: 180 cm/s

## 2016-08-27 NOTE — Progress Notes (Signed)
*  PRELIMINARY RESULTS* Echocardiogram 2D Echocardiogram has been performed.  Stacey DrainWhite, Kitrina Maurin J 08/27/2016, 12:13 PM

## 2016-08-31 ENCOUNTER — Encounter: Payer: Self-pay | Admitting: Obstetrics & Gynecology

## 2016-09-03 ENCOUNTER — Other Ambulatory Visit: Payer: Self-pay | Admitting: Gastroenterology

## 2016-09-03 DIAGNOSIS — R7989 Other specified abnormal findings of blood chemistry: Secondary | ICD-10-CM

## 2016-09-03 DIAGNOSIS — R945 Abnormal results of liver function studies: Principal | ICD-10-CM

## 2016-09-09 ENCOUNTER — Other Ambulatory Visit: Payer: Self-pay | Admitting: Gastroenterology

## 2016-09-09 ENCOUNTER — Ambulatory Visit (INDEPENDENT_AMBULATORY_CARE_PROVIDER_SITE_OTHER): Payer: Commercial Managed Care - PPO | Admitting: Cardiovascular Disease

## 2016-09-09 ENCOUNTER — Encounter: Payer: Self-pay | Admitting: Cardiovascular Disease

## 2016-09-09 VITALS — BP 106/64 | HR 68 | Ht 66.0 in | Wt 185.0 lb

## 2016-09-09 DIAGNOSIS — R7989 Other specified abnormal findings of blood chemistry: Secondary | ICD-10-CM | POA: Diagnosis not present

## 2016-09-09 DIAGNOSIS — R072 Precordial pain: Secondary | ICD-10-CM | POA: Diagnosis not present

## 2016-09-09 DIAGNOSIS — R945 Abnormal results of liver function studies: Principal | ICD-10-CM

## 2016-09-09 NOTE — Addendum Note (Signed)
Addended by: Gelene MinkBOONE, ANNA W on: 09/09/2016 01:57 PM   Modules accepted: Orders

## 2016-09-09 NOTE — Progress Notes (Signed)
SUBJECTIVE: Patient presents for follow up of palpitations/arrhythmia. Saw L. Kilroy PA-C on 08/25/16 for elevated d-dimer evaluation. Complained of vague chest discomfort.  Underwent normal echocardiogram 08/27/16, EF 55-60%.  Feeling well now. 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    Review of Systems: As per "subjective", otherwise negative.  Allergies  Allergen Reactions  . Epinephrine Other (See Comments)    DUE TO PALPITATIONS  . Lidocaine Other (See Comments)    TACHYCARDIA  . Penicillins Hives    AS A CHILD - STATES CAN TAKE AMOXICILLIN Has patient had a PCN reaction causing immediate rash, facial/tongue/throat swelling, SOB or lightheadedness with hypotension: Yes Has patient had a PCN reaction causing severe rash involving mucus membranes or skin necrosis: Yes Has patient had a PCN reaction that required hospitalization Unknown Has patient had a PCN reaction occurring within the last 10 years: No If all of the above answers are "NO", then may proceed with Cephalosporin use.     Current Outpatient Prescriptions  Medication Sig Dispense Refill  . calcium carbonate (TUMS EX) 750 MG chewable tablet Chew 1 tablet by mouth daily as needed.     . cetirizine (ZYRTEC) 10 MG tablet Take 10 mg by mouth daily.    . cholecalciferol (VITAMIN D) 1000 units tablet Take 1,000 Units by mouth daily.    . citalopram (CELEXA) 20 MG tablet Take 1/2 tablet daaily (Patient taking differently: Take 1/2 tablet daily at bedtime) 15 tablet 11  . ibuprofen (ADVIL,MOTRIN) 800 MG tablet Take 1 tablet (800 mg total) by mouth 3 (three) times daily. (Patient taking differently: Take 800 mg by mouth 2 (two) times daily as needed for moderate pain. ) 21 tablet 0  . levonorgestrel (MIRENA) 20 MCG/24HR IUD 1 each by Intrauterine route once.    . thyroid (NP THYROID) 30 MG tablet      No current facility-administered medications for this visit.     Past Medical History:  Diagnosis  Date  . Abnormal Pap smear   . Adenotonsillar hypertrophy    denies snoring during sleep or apnea  . Anxiety   . Complication of anesthesia    PT doesnt want any heavy narcotics  . GERD (gastroesophageal reflux disease)   . HPV (human papilloma virus) infection   . IUD (intrauterine device) in place   . Palpitations   . Panic attack   . RUQ pain 01/02/2016  . Strep throat    to start antibiotic 08/20/2014    Past Surgical History:  Procedure Laterality Date  . CERVICAL CONIZATION W/BX N/A 08/27/2014   Procedure: CONIZATION CERVIX ;  Surgeon: Lazaro ArmsLuther H Eure, MD;  Location: [facility];  Service: Gynecology;  Laterality: N/A;  Laser conization and ablation of cervix  . CHOLECYSTECTOMY N/A 07/22/2016   Procedure: LAPAROSCOPIC CHOLECYSTECTOMY;  Surgeon: Franky MachoMark Jenkins, MD;  Location: [facility];  Service: General;  Laterality: N/A;  . CO2 LASER APPLICATION N/A 08/27/2014   Procedure: CO2 LASER APPLICATION;  Surgeon: Lazaro ArmsLuther H Eure, MD;  Location: [facility];  Service: Gynecology;  Laterality: N/A;  . LIVER BIOPSY N/A 07/22/2016   Procedure: LIVER BIOPSY;  Surgeon: Franky MachoMark Jenkins, MD;  Location: [facility];  Service: General;  Laterality: N/A;  . TONSILLECTOMY AND ADENOIDECTOMY Bilateral 08/27/2014   Procedure: BILATERAL TONSILLECTOMY AND ADENOIDECTOMY;  Surgeon: Darletta MollSui W Teoh, MD;  Location: [facility];  Service: ENT;  Laterality: Bilateral;  . TYMPANOSTOMY TUBE PLACEMENT Bilateral  Social History   Social History  . Marital status: Married    Spouse name: N/A  . Number of children: 3  . Years of education: N/A   Occupational History  . Not on file.   Social History Main Topics  . Smoking status: Never Smoker  . Smokeless tobacco: Never Used  . Alcohol use No  . Drug use: No  . Sexual activity: Yes    Birth control/ protection: IUD   Other Topics Concern  . Not on file   Social History Narrative  . No narrative on file     Vitals:    09/09/16 1310  BP: 106/64  Pulse: 68  SpO2: 97%  Weight: 185 lb (83.9 kg)  Height: 5\' 6"  (1.676 m)    PHYSICAL EXAM General: NAD HEENT: Normal. Neck: No JVD, no thyromegaly. Lungs: Clear to auscultation bilaterally with normal respiratory effort. CV: Nondisplaced PMI.  Regular rate and rhythm, normal S1/S2, no S3/S4, no murmur. No pretibial or periankle edema.   Abdomen: Soft, nontender, no distention.  Neurologic: Alert and oriented.  Psych: Normal affect. Skin: Normal.    ECG: Most recent ECG reviewed.      ASSESSMENT AND PLAN: 1. Arrhythmia: Palpitations are self limiting, hasn't had any in some time.  2. Chest discomfort/elevated d-dimer: No known thromboembolic disease. Echocardiogram was normal. Symptoms have resolved with Rx for GERD.  Dispo: f/u prn   Prentice DockerSuresh Shonita Rinck, M.D., F.A.C.C.

## 2016-09-09 NOTE — Patient Instructions (Signed)
Medication Instructions:  Your physician recommends that you continue on your current medications as directed. Please refer to the Current Medication list given to you today.   Labwork: NONE  Testing/Procedures: NONE  Follow-Up: Your physician recommends that you schedule a follow-up appointment in: AS NEEDED      Any Other Special Instructions Will Be Listed Below (If Applicable).     If you need a refill on your cardiac medications before your next appointment, please call your pharmacy.   

## 2016-09-10 LAB — IGA: IgA: 140 mg/dL (ref 81–463)

## 2016-09-10 LAB — ANTI-SMOOTH MUSCLE ANTIBODY, IGG: Smooth Muscle Ab: 44 U — ABNORMAL HIGH (ref <20)

## 2016-09-10 LAB — TISSUE TRANSGLUTAMINASE, IGA: Tissue Transglutaminase Ab, IgA: 1 U/mL (ref <4)

## 2016-09-11 LAB — CERULOPLASMIN: Ceruloplasmin: 30 mg/dL (ref 18–53)

## 2016-09-11 LAB — MITOCHONDRIAL ANTIBODIES

## 2016-09-16 ENCOUNTER — Telehealth: Payer: Self-pay | Admitting: Gastroenterology

## 2016-09-16 NOTE — Telephone Encounter (Signed)
PT is aware that Tobi Bastosnna is not here today and I will forward the message to her to address tomorrow.

## 2016-09-16 NOTE — Telephone Encounter (Signed)
PLEASE CALL PATIENT  ABOUT LAB RESULTS, THEY ARE IN MY CHART BUT SHE DOESN'T KNOW WHAT THEY MEAN

## 2016-09-22 NOTE — Telephone Encounter (Signed)
I have contacted patient and informed. I am running it by Dr. Darrick PennaFields. Sorry, I didn't document on the result notes yet, but I spoke with her last week. I will make a notation.

## 2016-09-22 NOTE — Telephone Encounter (Signed)
Tobi Bastosnna, can you address pt's labs? Thanks!

## 2016-09-22 NOTE — Progress Notes (Signed)
Late entry. Reviewed results with patient last week. Celiac serologies negative. ASMA still elevated but down from last draw. AMA negative. Discussing with Dr. Darrick PennaFields.

## 2016-09-23 NOTE — Progress Notes (Signed)
No autoimmune etiology. 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 Need to review drug list and any OTC agents. Will recheck in 6 months. Patient notified.

## 2016-09-23 NOTE — Telephone Encounter (Addendum)
EMR REVIEWED FROM 2011 TO JAN 2018. FLUCTUATING ALT ASSOCIATED WITH POS ASMA. IMAGING AND LIVER Bx-NONSPECIFIC HEPATITIS. 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 WEIGHT 2014: 172 LBS,  BMI 27.27 Jun 2016 182 LBS, BMI 30.4. RECOMMEND WEIGHT LOSS. LOW FAT DIET.

## 2016-09-24 ENCOUNTER — Telehealth: Payer: Self-pay | Admitting: Gastroenterology

## 2016-09-24 NOTE — Telephone Encounter (Signed)
Please arrange for repeat LFTs in 6 months.

## 2016-09-25 ENCOUNTER — Other Ambulatory Visit: Payer: Self-pay

## 2016-09-25 DIAGNOSIS — R945 Abnormal results of liver function studies: Principal | ICD-10-CM

## 2016-09-25 DIAGNOSIS — R7989 Other specified abnormal findings of blood chemistry: Secondary | ICD-10-CM

## 2016-09-25 NOTE — Progress Notes (Signed)
Patient had ingested Ibuprofen 800 mg almost on a daily basis prior to having cholecystectomy. This was likely culprit. [name], please arrange HFP in 6 months. She is aware she will need this.

## 2016-09-25 NOTE — Telephone Encounter (Signed)
Pt is aware of results and plan.

## 2016-09-25 NOTE — Telephone Encounter (Signed)
Lab order on file. 

## 2016-09-28 NOTE — Progress Notes (Signed)
HFP lab order on file.

## 2016-10-23 ENCOUNTER — Ambulatory Visit (HOSPITAL_COMMUNITY)
Admission: EM | Admit: 2016-10-23 | Discharge: 2016-10-23 | Disposition: A | Discharge status: Home / Self Care | Payer: Commercial Managed Care - PPO | Attending: Family Medicine | Admitting: Family Medicine

## 2016-10-23 ENCOUNTER — Encounter (HOSPITAL_COMMUNITY): Payer: Self-pay | Admitting: Emergency Medicine

## 2016-10-23 DIAGNOSIS — J069 Acute upper respiratory infection, unspecified: Secondary | ICD-10-CM | POA: Diagnosis not present

## 2016-10-23 MED ORDER — PREDNISONE 50 MG PO TABS: ORAL_TABLET | ORAL | 0 refills | Status: DC

## 2016-10-23 MED ORDER — IPRATROPIUM BROMIDE 0.06 % NA SOLN: 2.0000 | Freq: Four times a day (QID) | NASAL | 0 refills | Status: DC

## 2016-10-23 NOTE — Discharge Instructions (Signed)
°  You may take the Mucinex D or Sudafed PE. Your choice for which works best for you. Sudafed PE 10 mg every 4 to 6 hours as needed for congestion Allegra or Zyrtec daily as needed for drainage and runny nose. For stronger antihistamine may take Chlor-Trimeton 2 to 4 mg every 4 to 6 hours, may cause drowsiness. Saline nasal spray used frequently. Ibuprofen 600 mg every 6 hours as needed for pain, discomfort or fever. Drink plenty of fluids and stay well-hydrated. Use the Atrovent nasal spray which works well for nasal drainage.

## 2016-10-23 NOTE — ED Triage Notes (Signed)
Pt c/o cold sx onset: 2-3 days  Sx include: prod cough, facial pressure, HA  Denies: fevers, chills  Taking: OTC cold meds w/temp relief.   A&O x4... NAD

## 2016-10-23 NOTE — ED Provider Notes (Signed)
CSN: [id number]     Arrival date & time 10/23/16  1410 History   First MD Initiated Contact with Patient 10/23/16 1546     Chief Complaint  Patient presents with  . URI   (Consider location/radiation/quality/duration/timing/severity/associated sxs/prior Treatment) 36 year old female complaining of upper respiratory congestion, headache, stuffiness, runny nose and earache. Denies fever. States she is taking Mucinex D.      Past Medical History:  Diagnosis Date  . Abnormal Pap smear   . Adenotonsillar hypertrophy    denies snoring during sleep or apnea  . Anxiety   . Complication of anesthesia    PT doesnt want any heavy narcotics  . GERD (gastroesophageal reflux disease)   . HPV (human papilloma virus) infection   . IUD (intrauterine device) in place   . Palpitations   . Panic attack   . RUQ pain 01/02/2016  . Strep throat    to start antibiotic 08/20/2014   Past Surgical History:  Procedure Laterality Date  . CERVICAL CONIZATION W/BX N/A 08/27/2014   Procedure: CONIZATION CERVIX ;  Surgeon: Lazaro Arms, MD;  Location: [facility];  Service: Gynecology;  Laterality: N/A;  Laser conization and ablation of cervix  . CHOLECYSTECTOMY N/A 07/22/2016   Procedure: LAPAROSCOPIC CHOLECYSTECTOMY;  Surgeon: Franky Macho, MD;  Location: [facility];  Service: General;  Laterality: N/A;  . CO2 LASER APPLICATION N/A 08/27/2014   Procedure: CO2 LASER APPLICATION;  Surgeon: Lazaro Arms, MD;  Location: [facility];  Service: Gynecology;  Laterality: N/A;  . LIVER BIOPSY N/A 07/22/2016   Procedure: LIVER BIOPSY;  Surgeon: Franky Macho, MD;  Location: [facility];  Service: General;  Laterality: N/A;  . TONSILLECTOMY AND ADENOIDECTOMY Bilateral 08/27/2014   Procedure: BILATERAL TONSILLECTOMY AND ADENOIDECTOMY;  Surgeon: Darletta Moll, MD;  Location: [facility];  Service: ENT;  Laterality: Bilateral;  . TYMPANOSTOMY TUBE PLACEMENT Bilateral    Family History   Problem Relation Age of Onset  . Hypertension Mother   . Heart disease Maternal Grandmother     pacemaker  . Gallbladder disease Maternal Grandmother   . Hypertension Father   . Heart disease Father     pacemaker  . Cancer Paternal Grandfather     colon cancer  . Stroke Paternal Grandfather   . Macular degeneration Paternal Grandmother   . Leukemia Son   . Asthma Son   . Colon cancer Neg Hx   . Liver disease Neg Hx    Social History  Substance Use Topics  . Smoking status: Never Smoker  . Smokeless tobacco: Never Used  . Alcohol use No   OB History    Gravida Para Term Preterm AB Living   3 3 2     3    SAB TAB Ectopic Multiple Live Births           2     Review of Systems  Constitutional: Negative for activity change, appetite change, chills, fatigue and fever.  HENT: Positive for congestion, ear pain, postnasal drip and rhinorrhea. Negative for facial swelling.   Eyes: Negative.   Respiratory: Negative.   Cardiovascular: Negative.   Musculoskeletal: Negative for neck pain and neck stiffness.  Skin: Negative for pallor and rash.  Neurological: Negative.   All other systems reviewed and are negative.   Allergies  Epinephrine; Lidocaine; and Penicillins  Home Medications   Prior to Admission medications   Medication Sig Start Date End Date Taking? Authorizing Provider  cetirizine (ZYRTEC) 10 MG  tablet Take 10 mg by mouth daily.   Yes Historical Provider, MD  citalopram (CELEXA) 20 MG tablet Take 1/2 tablet daaily Patient taking differently: Take 1/2 tablet daily at bedtime 01/02/16  Yes Adline Potter, NP  ibuprofen (ADVIL,MOTRIN) 800 MG tablet Take 1 tablet (800 mg total) by mouth 3 (three) times daily. Patient taking differently: Take 800 mg by mouth 2 (two) times daily as needed for moderate pain.  06/19/16  Yes Maia Plan, MD  levonorgestrel (MIRENA) 20 MCG/24HR IUD 1 each by Intrauterine route once.   Yes Historical Provider, MD  thyroid (NP THYROID)  30 MG tablet  08/27/16  Yes Historical Provider, MD  calcium carbonate (TUMS EX) 750 MG chewable tablet Chew 1 tablet by mouth daily as needed.     Historical Provider, MD  cholecalciferol (VITAMIN D) 1000 units tablet Take 1,000 Units by mouth daily.    Historical Provider, MD  ipratropium (ATROVENT) 0.06 % nasal spray Place 2 sprays into both nostrils 4 (four) times daily. 10/23/16   Hayden Rasmussen, NP  predniSONE (DELTASONE) 50 MG tablet 1 tab po daily for 6 days. Take with food. 10/23/16   Hayden Rasmussen, NP   Meds Ordered and Administered this Visit  Medications - No data to display  BP 109/59 (BP Location: Left Arm)   Pulse 87   Temp 98.8 F (37.1 C) (Oral)   Resp 16   SpO2 100%  No data found.   Physical Exam  Constitutional: She is oriented to person, place, and time. She appears well-developed and well-nourished. No distress.  HENT:  Mouth/Throat: No oropharyngeal exudate.  Bilateral TMs are retracted. Oropharynx with streaky erythema and light cobblestoning, clear PND. Runny nose with constant sniffling.  Neck: Normal range of motion. Neck supple.  Cardiovascular: Normal rate, regular rhythm and normal heart sounds.   Pulmonary/Chest: Effort normal and breath sounds normal. No respiratory distress. She has no wheezes.  Musculoskeletal: Normal range of motion. She exhibits no edema.  Lymphadenopathy:    She has no cervical adenopathy.  Neurological: She is alert and oriented to person, place, and time.  Skin: Skin is warm and dry. No rash noted.  Psychiatric: She has a normal mood and affect.  Nursing note and vitals reviewed.   Urgent Care Course     Procedures (including critical care time)  Labs Review Labs Reviewed - No data to display  Imaging Review No results found.   Visual Acuity Review  Right Eye Distance:   Left Eye Distance:   Bilateral Distance:    Right Eye Near:   Left Eye Near:    Bilateral Near:         MDM   1. Acute upper respiratory  infection    You may take the Mucinex D or Sudafed PE. Your choice for which works best for you. Sudafed PE 10 mg every 4 to 6 hours as needed for congestion Allegra or Zyrtec daily as needed for drainage and runny nose. For stronger antihistamine may take Chlor-Trimeton 2 to 4 mg every 4 to 6 hours, may cause drowsiness. Saline nasal spray used frequently. Ibuprofen 600 mg every 6 hours as needed for pain, discomfort or fever. Drink plenty of fluids and stay well-hydrated. Use the Atrovent nasal spray which works well for nasal drainage. Meds ordered this encounter  Medications  . ipratropium (ATROVENT) 0.06 % nasal spray    Sig: Place 2 sprays into both nostrils 4 (four) times daily.    Dispense:  15  mL    Refill:  0    Order Specific Question:   Supervising Provider    Answer:   Linna HoffKINDL, JAMES D [phone][[id number]]  . predniSONE (DELTASONE) 50 MG tablet    Sig: 1 tab po daily for 6 days. Take with food.    Dispense:  6 tablet    Refill:  0    Order Specific Question:   Supervising Provider    Answer:   Linna HoffKINDL, JAMES D [[id number]]        Hayden Rasmussenavid Hernando Reali, NP 10/23/16 1603    Hayden Rasmussenavid Jaedan Huttner, NP 10/23/16 [phone]

## 2016-10-28 DIAGNOSIS — E785 Hyperlipidemia, unspecified: Secondary | ICD-10-CM | POA: Diagnosis not present

## 2016-10-28 DIAGNOSIS — R945 Abnormal results of liver function studies: Secondary | ICD-10-CM | POA: Diagnosis not present

## 2016-10-28 DIAGNOSIS — E559 Vitamin D deficiency, unspecified: Secondary | ICD-10-CM | POA: Diagnosis not present

## 2016-10-28 DIAGNOSIS — Z0000 Encounter for general adult medical examination without abnormal findings: Secondary | ICD-10-CM | POA: Diagnosis not present

## 2016-10-28 DIAGNOSIS — Z0389 Encounter for observation for other suspected diseases and conditions ruled out: Secondary | ICD-10-CM | POA: Diagnosis not present

## 2016-10-28 DIAGNOSIS — E039 Hypothyroidism, unspecified: Secondary | ICD-10-CM | POA: Diagnosis not present

## 2017-01-25 ENCOUNTER — Other Ambulatory Visit: Payer: Self-pay | Admitting: Adult Health

## 2017-02-25 ENCOUNTER — Other Ambulatory Visit: Payer: Self-pay

## 2017-02-25 DIAGNOSIS — R945 Abnormal results of liver function studies: Principal | ICD-10-CM

## 2017-02-25 DIAGNOSIS — R7989 Other specified abnormal findings of blood chemistry: Secondary | ICD-10-CM

## 2017-06-30 ENCOUNTER — Ambulatory Visit (INDEPENDENT_AMBULATORY_CARE_PROVIDER_SITE_OTHER): Payer: Commercial Managed Care - PPO | Admitting: Physician Assistant

## 2017-06-30 ENCOUNTER — Encounter: Payer: Self-pay | Admitting: Physician Assistant

## 2017-06-30 VITALS — BP 116/70 | HR 68 | Ht 65.0 in | Wt 178.0 lb

## 2017-06-30 DIAGNOSIS — F419 Anxiety disorder, unspecified: Secondary | ICD-10-CM

## 2017-06-30 DIAGNOSIS — R079 Chest pain, unspecified: Secondary | ICD-10-CM | POA: Diagnosis not present

## 2017-06-30 DIAGNOSIS — E039 Hypothyroidism, unspecified: Secondary | ICD-10-CM

## 2017-06-30 NOTE — Progress Notes (Signed)
Cardiology Office Note    Date:  06/30/2017   ID:  Heather [name], DOB [birth year], MRN [id number]  PCP:  Wilson Singer, MD  Cardiologist: Dr. Purvis Sheffield  Chief Complaint  Patient presents with  . Chest Pain    History of Present Illness:  Heather [name] is a 36 y.o. female with history of palpitations and chest discomfort in the past with elevated d-dimer in ED in [facility]. CTA of her chest and head were both normal according to patient. No known thromboembolic disease. Echocardiogram normal. Symptoms resolved with treatment of GERD. Last saw Dr. Purvis Sheffield 08/2016 and to f/u prn.  Patient added onto my schedule because of complaints of chest pain.Saw Dr. Karilyn Cota last week and thyroid med increased because of hair loss. Monday developed a headache, nausea with a pinching and heaviness in her chest and told to f/u here.She said the pain last about an hour and finally fell asleep. Felt it again yest and today. Complains of chest heaviness now but not as bad, also pinching and scratching. Pain in her esophagus area and sometimes upper left chest. Off/on all day. Nausea after she eat. Exercises on bike at [employer] twice a week and walks 1 mile without difficulty. Father had MI 69 and pacemaker, No HTN, DM, HLD. Under a lot of stress taking care of mother who is having brain surgery in Nov, her 34 yr old is on chemo for leukemia and 2 other children. Living in [place] and works in [place]. No recent problems with palpitations. Labs done by Dr. Karilyn Cota.      Past Medical History:  Diagnosis Date  . Abnormal Pap smear   . Adenotonsillar hypertrophy    denies snoring during sleep or apnea  . Anxiety   . Complication of anesthesia    PT doesnt want any heavy narcotics  . GERD (gastroesophageal reflux disease)   . HPV (human papilloma virus) infection   . IUD (intrauterine device) in place   . Palpitations   . Panic attack   . RUQ pain 01/02/2016  .  Strep throat    to start antibiotic 08/20/2014    Past Surgical History:  Procedure Laterality Date  . CERVICAL CONIZATION W/BX N/A 08/27/2014   Procedure: CONIZATION CERVIX ;  Surgeon: Lazaro Arms, MD;  Location: [facility];  Service: Gynecology;  Laterality: N/A;  Laser conization and ablation of cervix  . CHOLECYSTECTOMY N/A 07/22/2016   Procedure: LAPAROSCOPIC CHOLECYSTECTOMY;  Surgeon: Franky Macho, MD;  Location: [facility];  Service: General;  Laterality: N/A;  . CO2 LASER APPLICATION N/A 08/27/2014   Procedure: CO2 LASER APPLICATION;  Surgeon: Lazaro Arms, MD;  Location: [facility];  Service: Gynecology;  Laterality: N/A;  . LIVER BIOPSY N/A 07/22/2016   Procedure: LIVER BIOPSY;  Surgeon: Franky Macho, MD;  Location: [facility];  Service: General;  Laterality: N/A;  . TONSILLECTOMY AND ADENOIDECTOMY Bilateral 08/27/2014   Procedure: BILATERAL TONSILLECTOMY AND ADENOIDECTOMY;  Surgeon: Darletta Moll, MD;  Location: [facility];  Service: ENT;  Laterality: Bilateral;  . TYMPANOSTOMY TUBE PLACEMENT Bilateral     Current Medications: Current Meds  Medication Sig  . calcium carbonate (TUMS EX) 750 MG chewable tablet Chew 1 tablet by mouth daily as needed.   . cetirizine (ZYRTEC) 10 MG tablet Take 10 mg by mouth daily.  . cholecalciferol (VITAMIN D) 1000 units tablet Take 1,000 Units by mouth daily.  . citalopram (CELEXA) 20 MG tablet  TAKE ONE-HALF TABLET BY MOUTH ONCE DAILY  . ibuprofen (ADVIL,MOTRIN) 800 MG tablet Take 1 tablet (800 mg total) by mouth 3 (three) times daily. (Patient taking differently: Take 800 mg by mouth 2 (two) times daily as needed for moderate pain. )  . levonorgestrel (MIRENA) 20 MCG/24HR IUD 1 each by Intrauterine route once.  . thyroid (NP THYROID) 30 MG tablet      Allergies:   Epinephrine; Lidocaine; and Penicillins   Social History   Social History  . Marital status: Married    Spouse name: N/A  . Number of  children: 3  . Years of education: N/A   Social History Main Topics  . Smoking status: Never Smoker  . Smokeless tobacco: Never Used  . Alcohol use No  . Drug use: No  . Sexual activity: Yes    Birth control/ protection: IUD   Other Topics Concern  . None   Social History Narrative  . None     Family History:  The patient's family history includes Asthma in her son; Cancer in her paternal grandfather; Gallbladder disease in her maternal grandmother; Heart disease in her father and maternal grandmother; Hypertension in her father and mother; Leukemia in her son; Macular degeneration in her paternal grandmother; Stroke in her paternal grandfather.   ROS:   Please see the history of present illness.    Review of Systems  Constitution: Negative.  HENT: Negative.   Eyes: Negative.   Cardiovascular: Positive for chest pain.  Respiratory: Negative.   Hematologic/Lymphatic: Negative.   Musculoskeletal: Negative.  Negative for joint pain.  Gastrointestinal: Positive for nausea.  Genitourinary: Negative.   Neurological: Negative.    All other systems reviewed and are negative.   PHYSICAL EXAM:   VS:  BP 116/70   Pulse 68   Ht  (1.651 m)   Wt 178 lb (80.7 kg)   SpO2 99%   BMI 29.62 kg/m   Physical Exam  GEN: Well nourished, well developed, in no acute distress  Neck: no JVD, carotid bruits, or masses Cardiac:RRR; no murmurs, rubs, or gallops  Respiratory:  clear to auscultation bilaterally, normal work of breathing GI: soft, nontender, nondistended, + BS Ext: without cyanosis, clubbing, or edema, Good distal pulses bilaterally Neuro:  Alert and Oriented x 3, Strength and sensation are intact Psych: euthymic mood, full affect  Wt Readings from Last 3 Encounters:  06/30/17 178 lb (80.7 kg)  09/09/16 185 lb (83.9 kg)  08/25/16 181 lb (82.1 kg)      Studies/Labs Reviewed:   EKG:  EKG is ordered today.  The ekg ordered today demonstrates NSR, normal EKG  Recent  Labs: 07/20/2016: BUN 12; Creatinine, Ser 0.81; Hemoglobin 14.3; Platelets 173; Potassium 3.9; Sodium 137 08/24/2016: ALT 51   Lipid Panel No results found for: CHOL, TRIG, HDL, CHOLHDL, VLDL, LDLCALC, LDLDIRECT  Additional studies/ records that were reviewed today include:  2-D echo 12/2017Study Conclusions   - Left ventricle: The cavity size was normal. Wall thickness was   normal. Systolic function was normal. The estimated ejection   fraction was in the range of 55% to 60%. Wall motion was normal;   there were no regional wall motion abnormalities. Left   ventricular diastolic function parameters were normal. - Aortic valve: Valve area (VTI): 2.06 cm^2. Valve area (Vmax):   2.01 cm^2. Valve area (Vmean): 1.82 cm^2. - Technically adequate study.       ASSESSMENT:    1. Chest pain, unspecified type   2.  Anxiety   3. Hypothyroidism, unspecified type      PLAN:  In order of problems listed above:  Chest pain atypical with pinching, scratching, and some tightness associated with headache and nausea since thyroid medicine adjusted. EKG normal. Doubt cardiac and reassurance given. If she continues to have chest pain can always do GXT . She has decreased her thyroid medicine to see if it makes a difference. F/U prn.  Stress and anxiety see above  Hypothyroidism and med just titrated up but she went back to original dose.    Medication Adjustments/Labs and Tests Ordered: Current medicines are reviewed at length with the patient today.  Concerns regarding medicines are outlined above.  Medication changes, Labs and Tests ordered today are listed in the Patient Instructions below. Patient Instructions  Medication Instructions:  Your physician recommends that you continue on your current medications as directed. Please refer to the Current Medication list given to you today.   Labwork: NONE   Testing/Procedures: NONE   Follow-Up: Your physician recommends that you  schedule a follow-up appointment As Needed    Any Other Special Instructions Will Be Listed Below (If Applicable).     If you need a refill on your cardiac medications before your next appointment, please call your pharmacy.  Thank you for choosing [facility]!      Signed, Jacolyn Reedy, PA-C  06/30/2017 2:52 PM    [facility] [address] [place], [place], [place]  [address] Phone: [phone]; Fax: [phone]

## 2017-06-30 NOTE — Patient Instructions (Signed)
Medication Instructions:  Your physician recommends that you continue on your current medications as directed. Please refer to the Current Medication list given to you today.   Labwork: NONE   Testing/Procedures: NONE   Follow-Up: Your physician recommends that you schedule a follow-up appointment As Needed    Any Other Special Instructions Will Be Listed Below (If Applicable).     If you need a refill on your cardiac medications before your next appointment, please call your pharmacy.  Thank you for choosing [facility]!   

## 2017-08-09 ENCOUNTER — Other Ambulatory Visit (HOSPITAL_COMMUNITY)
Admission: RE | Admit: 2017-08-09 | Discharge: 2017-08-09 | Disposition: A | Discharge status: Home / Self Care | Payer: Commercial Managed Care - PPO | Source: Ambulatory Visit | Attending: Obstetrics & Gynecology | Admitting: Obstetrics & Gynecology

## 2017-08-09 ENCOUNTER — Encounter: Payer: Self-pay | Admitting: Obstetrics & Gynecology

## 2017-08-09 ENCOUNTER — Ambulatory Visit (INDEPENDENT_AMBULATORY_CARE_PROVIDER_SITE_OTHER): Payer: Commercial Managed Care - PPO | Admitting: Obstetrics & Gynecology

## 2017-08-09 VITALS — BP 104/52 | HR 78 | Ht 65.0 in | Wt 182.0 lb

## 2017-08-09 DIAGNOSIS — Z86001 Personal history of in-situ neoplasm of cervix uteri: Secondary | ICD-10-CM

## 2017-08-09 DIAGNOSIS — Z01419 Encounter for gynecological examination (general) (routine) without abnormal findings: Secondary | ICD-10-CM | POA: Diagnosis present

## 2017-08-09 DIAGNOSIS — D060 Carcinoma in situ of endocervix: Secondary | ICD-10-CM

## 2017-08-09 NOTE — Progress Notes (Signed)
Subjective:     Heather [name] is a 36 y.o. female here for a routine exam.  No LMP recorded. Patient is not currently having periods (Reason: IUD). [id number] Birth Control Method:  IUD Menstrual Calendar(currently): amenorrheic  Current complaints: a variable tender breast area.   Current acute medical issues:  none   Recent Gynecologic History No LMP recorded. Patient is not currently having periods (Reason: IUD). Last Pap: 2017,  normal Last mammogram: na,    Past Medical History:  Diagnosis Date  . Abnormal Pap smear   . Adenotonsillar hypertrophy    denies snoring during sleep or apnea  . Anxiety   . Complication of anesthesia    PT doesnt want any heavy narcotics  . GERD (gastroesophageal reflux disease)   . HPV (human papilloma virus) infection   . IUD (intrauterine device) in place   . Palpitations   . Panic attack   . RUQ pain 01/02/2016  . Strep throat    to start antibiotic 08/20/2014    Past Surgical History:  Procedure Laterality Date  . BILATERAL TONSILLECTOMY AND ADENOIDECTOMY Bilateral 08/27/2014   Performed by Darletta Moll, MD at [facility]  . CO2 LASER APPLICATION N/A 08/27/2014   Performed by Lazaro Arms, MD at [facility]  . CONIZATION CERVIX N/A 08/27/2014   Performed by Lazaro Arms, MD at [facility]  . LAPAROSCOPIC CHOLECYSTECTOMY N/A 07/22/2016   Performed by Franky Macho, MD at [facility]  . LIVER BIOPSY N/A 07/22/2016   Performed by Franky Macho, MD at [facility]  . TYMPANOSTOMY TUBE PLACEMENT Bilateral     OB History    Gravida Para Term Preterm AB Living   3 3 2     3    SAB TAB Ectopic Multiple Live Births           2      Social History   Socioeconomic History  . Marital status: Married    Spouse name: None  . Number of children: 3  . Years of education: None  . Highest education level: None  Social Needs  . Financial resource strain: None  . Food insecurity - worry: None  . Food  insecurity - inability: None  . Transportation needs - medical: None  . Transportation needs - non-medical: None  Occupational History  . None  Tobacco Use  . Smoking status: Never Smoker  . Smokeless tobacco: Never Used  Substance and Sexual Activity  . Alcohol use: No  . Drug use: No  . Sexual activity: Yes    Birth control/protection: IUD  Other Topics Concern  . None  Social History Narrative  . None    Family History  Problem Relation Age of Onset  . Hypertension Mother   . Heart disease Maternal Grandmother        pacemaker  . Gallbladder disease Maternal Grandmother   . Hypertension Father   . Heart disease Father        pacemaker  . Cancer Paternal Grandfather        colon cancer  . Stroke Paternal Grandfather   . Macular degeneration Paternal Grandmother   . Leukemia Son   . Asthma Son   . Colon cancer Neg Hx   . Liver disease Neg Hx      Current Outpatient Medications:  .  citalopram (CELEXA) 20 MG tablet, TAKE ONE-HALF TABLET BY MOUTH ONCE DAILY, Disp: 15 tablet, Rfl: 11 .  levonorgestrel (  MIRENA) 20 MCG/24HR IUD, 1 each by Intrauterine route once., Disp: , Rfl:  .  thyroid (NP THYROID) 30 MG tablet, , Disp: , Rfl:  .  calcium carbonate (TUMS EX) 750 MG chewable tablet, Chew 1 tablet by mouth daily as needed. , Disp: , Rfl:  .  cetirizine (ZYRTEC) 10 MG tablet, Take 10 mg by mouth daily., Disp: , Rfl:  .  cholecalciferol (VITAMIN D) 1000 units tablet, Take 1,000 Units by mouth daily., Disp: , Rfl:  .  ibuprofen (ADVIL,MOTRIN) 800 MG tablet, Take 1 tablet (800 mg total) by mouth 3 (three) times daily. (Patient not taking: Reported on 08/09/2017), Disp: 21 tablet, Rfl: 0  Review of Systems  Review of Systems  Constitutional: Negative for fever, chills, weight loss, malaise/fatigue and diaphoresis.  HENT: Negative for hearing loss, ear pain, nosebleeds, congestion, sore throat, neck pain, tinnitus and ear discharge.   Eyes: Negative for blurred vision,  double vision, photophobia, pain, discharge and redness.  Respiratory: Negative for cough, hemoptysis, sputum production, shortness of breath, wheezing and stridor.   Cardiovascular: Negative for chest pain, palpitations, orthopnea, claudication, leg swelling and PND.  Gastrointestinal: negative for abdominal pain. Negative for heartburn, nausea, vomiting, diarrhea, constipation, blood in stool and melena.  Genitourinary: Negative for dysuria, urgency, frequency, hematuria and flank pain.  Musculoskeletal: Negative for myalgias, back pain, joint pain and falls.  Skin: Negative for itching and rash.  Neurological: Negative for dizziness, tingling, tremors, sensory change, speech change, focal weakness, seizures, loss of consciousness, weakness and headaches.  Endo/Heme/Allergies: Negative for environmental allergies and polydipsia. Does not bruise/bleed easily.  Psychiatric/Behavioral: Negative for depression, suicidal ideas, hallucinations, memory loss and substance abuse. The patient is not nervous/anxious and does not have insomnia.        Objective:  Blood pressure (!) 104/52, pulse 78, height 5\' 5"  (1.651 m), weight 182 lb (82.6 kg).   Physical Exam  Vitals reviewed. Constitutional: She is oriented to person, place, and time. She appears well-developed and well-nourished.  HENT:  Head: Normocephalic and atraumatic.        Right Ear: External ear normal.  Left Ear: External ear normal.  Nose: Nose normal.  Mouth/Throat: Oropharynx is clear and moist.  Eyes: Conjunctivae and EOM are normal. Pupils are equal, round, and reactive to light. Right eye exhibits no discharge. Left eye exhibits no discharge. No scleral icterus.  Neck: Normal range of motion. Neck supple. No tracheal deviation present. No thyromegaly present.  Cardiovascular: Normal rate, regular rhythm, normal heart sounds and intact distal pulses.  Exam reveals no gallop and no friction rub.   No murmur heard. Respiratory:  Effort normal and breath sounds normal. No respiratory distress. She has no wheezes. She has no rales. She exhibits no tenderness.  GI: Soft. Bowel sounds are normal. She exhibits no distension and no mass. There is no tenderness. There is no rebound and no guarding.  Genitourinary:  Breasts no masses skin changes or nipple changes bilaterally      Vulva is normal without lesions Vagina is pink moist without discharge Cervix normal in appearance and pap is done Uterus is normal size shape and contour Adnexa is negative with normal sized ovaries   Musculoskeletal: Normal range of motion. She exhibits no edema and no tenderness.  Neurological: She is alert and oriented to person, place, and time. She has normal reflexes. She displays normal reflexes. No cranial nerve deficit. She exhibits normal muscle tone. Coordination normal.  Skin: Skin is warm and dry. No  rash noted. No erythema. No pallor.  Psychiatric: She has a normal mood and affect. Her behavior is normal. Judgment and thought content normal.       Medications Ordered at today's visit: No orders of the defined types were placed in this encounter.   Other orders placed at today's visit: No orders of the defined types were placed in this encounter.     Assessment:    Healthy female exam.   Hx of cervical CIS Plan:    Contraception: IUD. Follow up in: 1 year.     Return in about 1 year (around 08/09/2018) for yearly, with Dr Despina HiddenEure.

## 2017-08-10 LAB — CYTOLOGY - PAP
Chlamydia: NEGATIVE
DIAGNOSIS: NEGATIVE
HPV: NOT DETECTED
Neisseria Gonorrhea: NEGATIVE

## 2017-08-22 IMAGING — US US ABDOMEN LIMITED
1 series · 14 of 25 positions shown · non-contrast
Comparison: None.

CLINICAL DATA: Right upper quadrant pain for 3 weeks

EXAM:
US ABDOMEN LIMITED - RIGHT UPPER QUADRANT

[Series 1: us abdomen limited · 0.22mm/px · 14 of 93 slices shown]
[im 1/93]
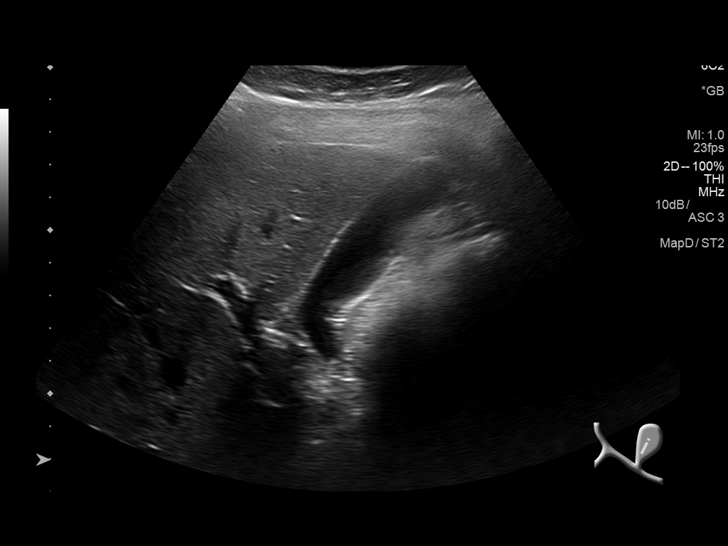
[im 8/93]
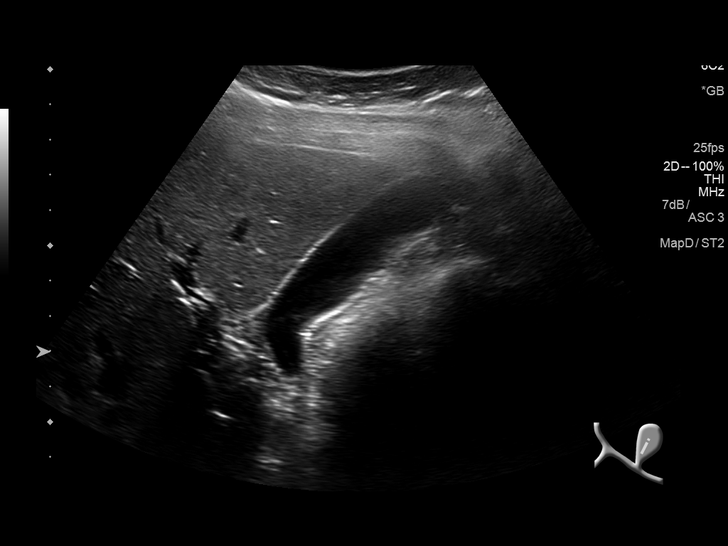
[im 16/93]
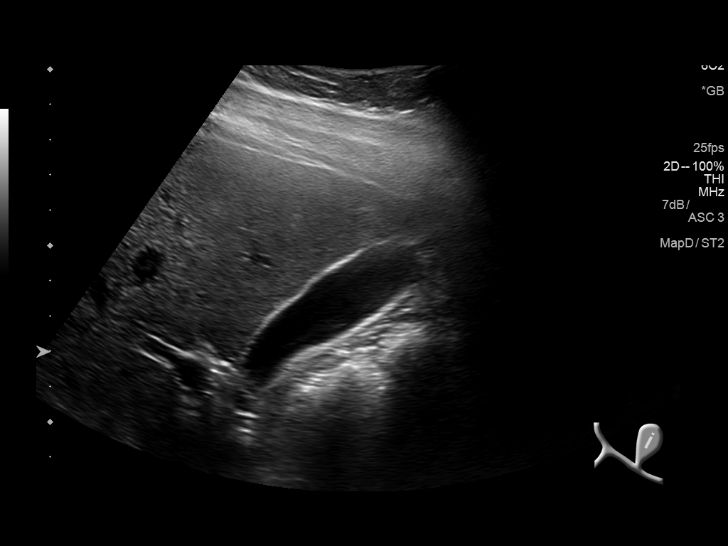
[im 24/93]
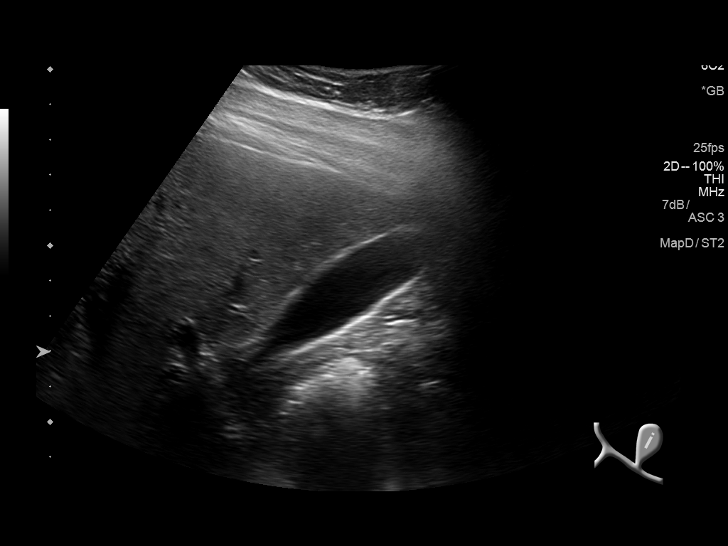
[im 31/93]
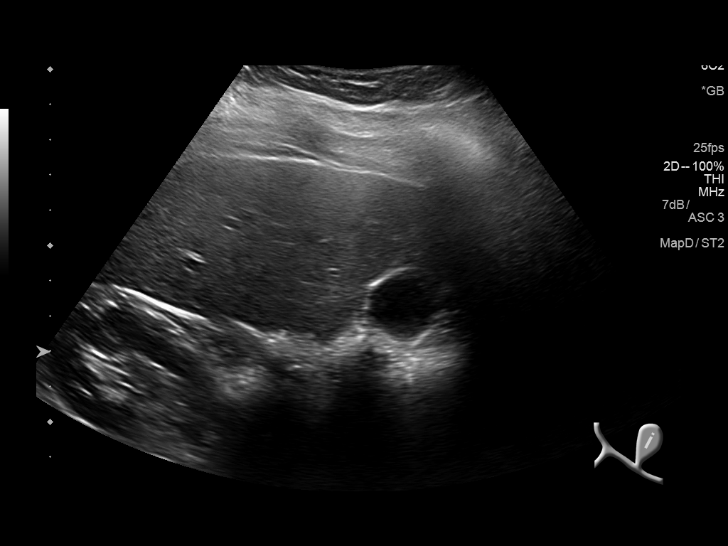
[im 35/93]
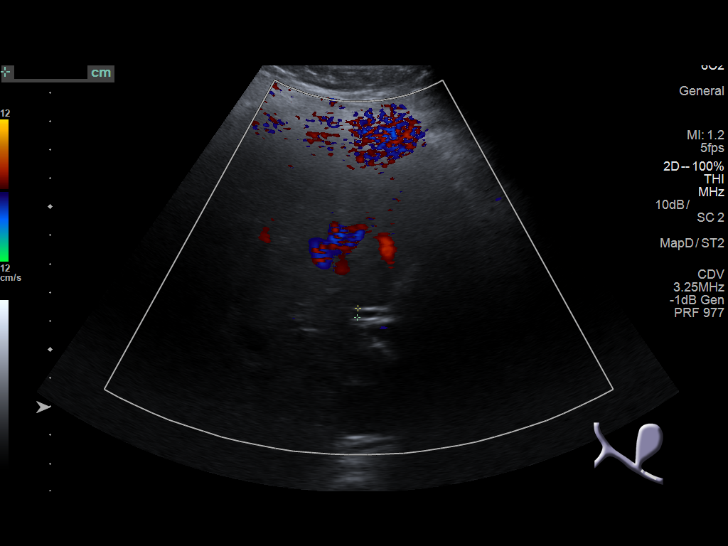
[im 43/93]
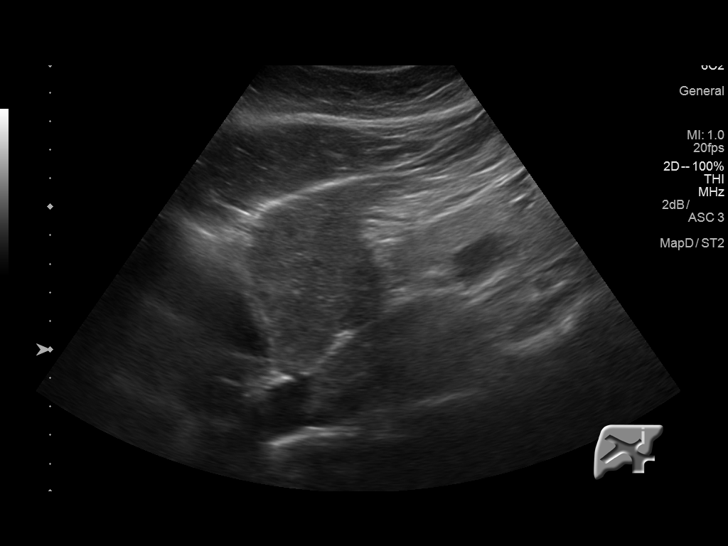
[im 50/93]
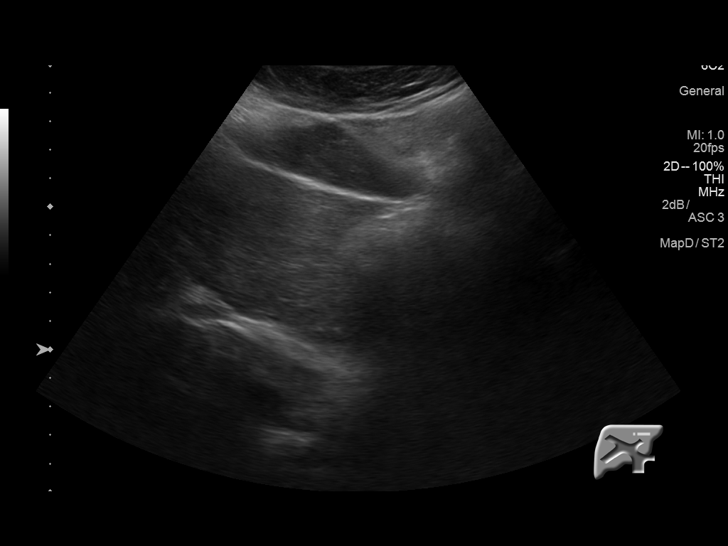
[im 58/93]
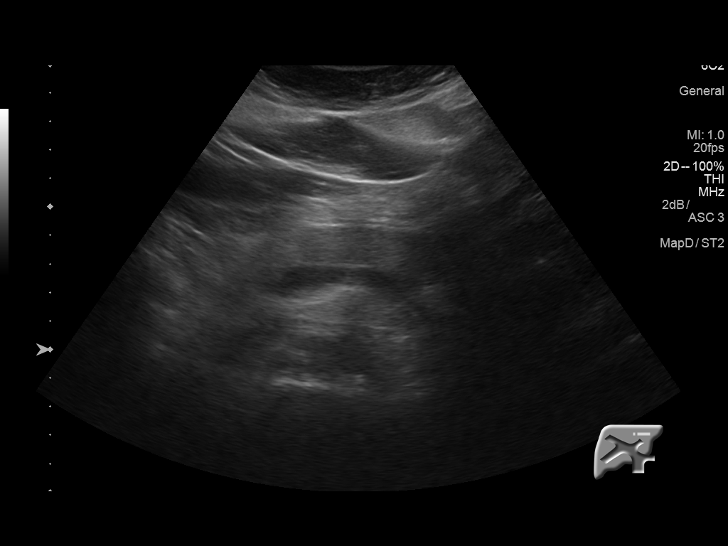
[im 62/93]
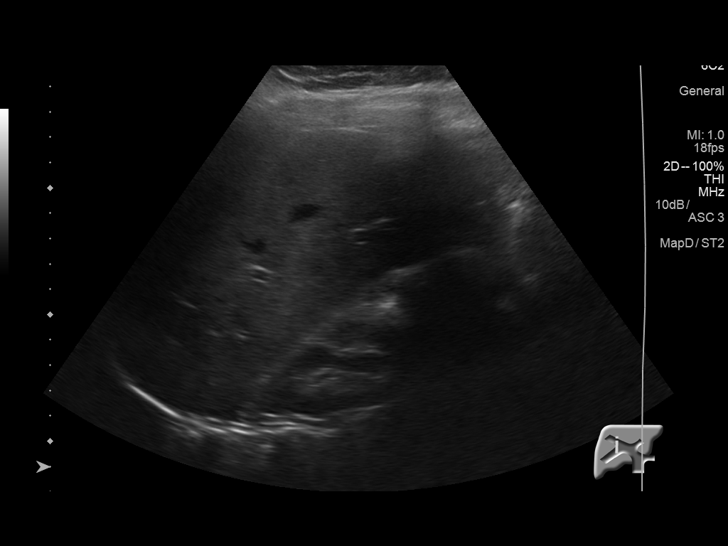
[im 70/93]
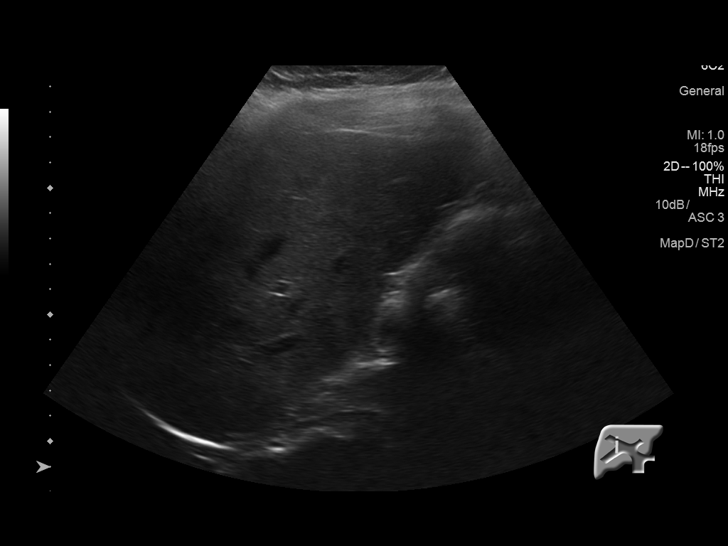
[im 77/93]
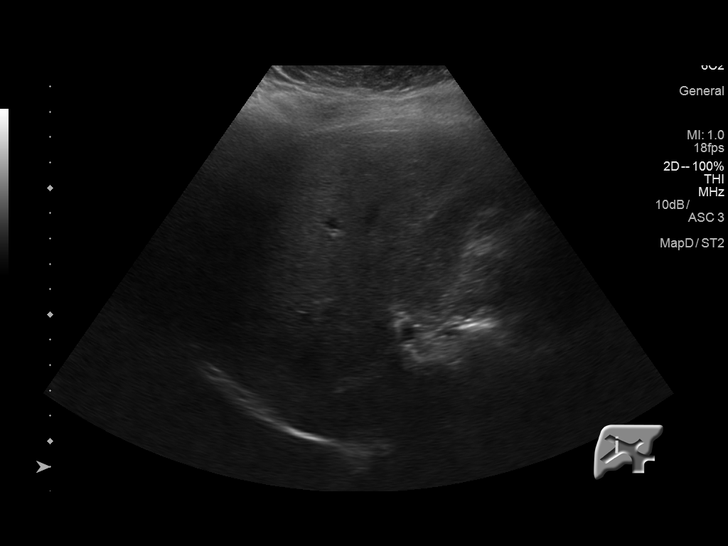
[im 85/93]
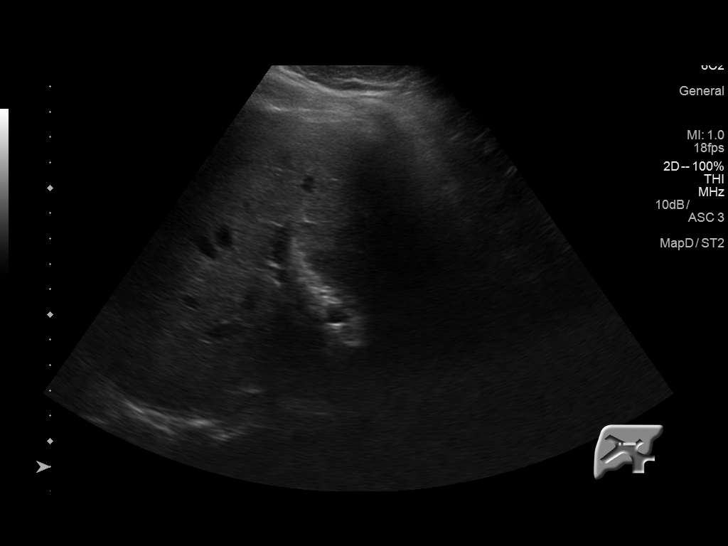
[im 93/93]
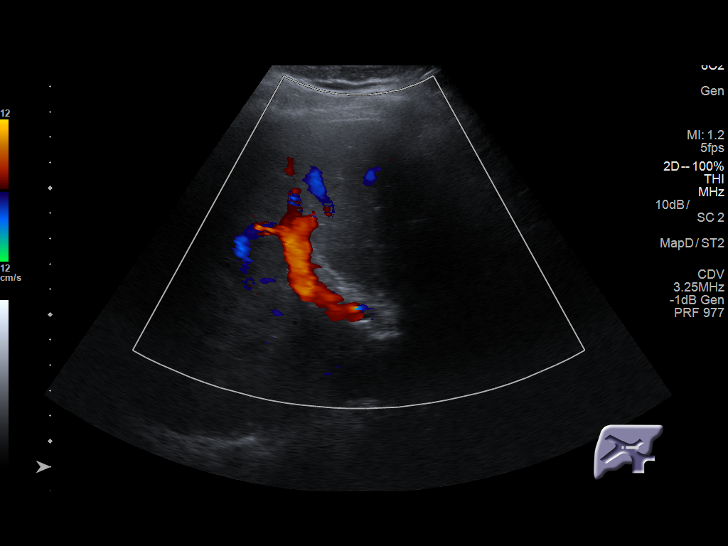

[14 of 25 positions shown; findings below may reference images not displayed]

FINDINGS: Gallbladder:

No gallstones or wall thickening visualized. No sonographic Murphy
sign noted.

Common bile duct:

Diameter: 3 mm

Liver:

No focal lesion identified. Within normal limits in parenchymal
echogenicity.
IMPRESSION: Gallbladder, biliary tree, and liver are within normal limits.

## 2017-10-12 ENCOUNTER — Other Ambulatory Visit (HOSPITAL_COMMUNITY): Payer: Self-pay | Admitting: Internal Medicine

## 2017-10-12 ENCOUNTER — Ambulatory Visit (HOSPITAL_COMMUNITY)
Admission: RE | Admit: 2017-10-12 | Discharge: 2017-10-12 | Disposition: A | Discharge status: Home / Self Care | Payer: Commercial Managed Care - PPO | Source: Ambulatory Visit | Attending: Internal Medicine | Admitting: Internal Medicine

## 2017-10-12 DIAGNOSIS — R200 Anesthesia of skin: Secondary | ICD-10-CM | POA: Diagnosis present

## 2017-10-15 ENCOUNTER — Ambulatory Visit (INDEPENDENT_AMBULATORY_CARE_PROVIDER_SITE_OTHER): Payer: Commercial Managed Care - PPO | Admitting: Gastroenterology

## 2017-10-15 ENCOUNTER — Encounter: Payer: Self-pay | Admitting: Gastroenterology

## 2017-10-15 VITALS — BP 110/66 | HR 71 | Temp 97.0°F | Ht 65.0 in | Wt 186.0 lb

## 2017-10-15 DIAGNOSIS — R079 Chest pain, unspecified: Secondary | ICD-10-CM

## 2017-10-15 DIAGNOSIS — R945 Abnormal results of liver function studies: Secondary | ICD-10-CM

## 2017-10-15 DIAGNOSIS — R7989 Other specified abnormal findings of blood chemistry: Secondary | ICD-10-CM

## 2017-10-15 LAB — HEPATIC FUNCTION PANEL
AG Ratio: 1.9 (calc) (ref 1.0–2.5)
ALBUMIN MSPROF: 4.5 g/dL (ref 3.6–5.1)
ALT: 188 U/L — ABNORMAL HIGH (ref 6–29)
AST: 55 U/L — AB (ref 10–30)
Alkaline phosphatase (APISO): 112 U/L (ref 33–115)
BILIRUBIN DIRECT: 0.2 mg/dL (ref 0.0–0.2)
BILIRUBIN INDIRECT: 0.7 mg/dL (ref 0.2–1.2)
BILIRUBIN TOTAL: 0.9 mg/dL (ref 0.2–1.2)
Globulin: 2.4 g/dL (calc) (ref 1.9–3.7)
Total Protein: 6.9 g/dL (ref 6.1–8.1)

## 2017-10-15 MED ORDER — PANTOPRAZOLE SODIUM 40 MG PO TBEC: 40.0000 mg | DELAYED_RELEASE_TABLET | Freq: Every day | ORAL | 3 refills | Status: DC

## 2017-10-15 NOTE — Progress Notes (Signed)
Referring Provider: Wilson Singer, MD Primary Care Physician:  Wilson Singer, MD Primary GI: Dr. Darrick Penna   Chief Complaint  Patient presents with  . Bloated  . Abdominal Pain    x 2 days left side  . Gastroesophageal Reflux    HPI:   Heather [name] is a 37 y.o. female presenting today with a history of elevated LFTs, ASMA elevated and s/p liver biopsy that revealed a non-specific hepatitis. This was felt to be secondary to meds or mild steatohepatitis. She had a cholecystectomy in 2017.   Gets nauseated with eating, some bloating. Has chest discomfort that will hit at random times of the day. No prior EGD. Tums with chest discomfort. Pepcid daily. Belching with acid/sour taste coming up. Ibuprofen sparingly. No dysphagia. Has been under significant stress. Mother passed away recently, son finishing chemo.  Past Medical History:  Diagnosis Date  . Abnormal Pap smear   . Adenotonsillar hypertrophy    denies snoring during sleep or apnea  . Anxiety   . Complication of anesthesia    PT doesnt want any heavy narcotics  . GERD (gastroesophageal reflux disease)   . HPV (human papilloma virus) infection   . IUD (intrauterine device) in place   . Palpitations   . Panic attack   . RUQ pain 01/02/2016  . Strep throat    to start antibiotic 08/20/2014    Past Surgical History:  Procedure Laterality Date  . CERVICAL CONIZATION W/BX N/A 08/27/2014   Procedure: CONIZATION CERVIX ;  Surgeon: Lazaro Arms, MD;  Location: [facility];  Service: Gynecology;  Laterality: N/A;  Laser conization and ablation of cervix  . CHOLECYSTECTOMY N/A 07/22/2016   Procedure: LAPAROSCOPIC CHOLECYSTECTOMY;  Surgeon: Franky Macho, MD;  Location: [facility];  Service: General;  Laterality: N/A;  . CO2 LASER APPLICATION N/A 08/27/2014   Procedure: CO2 LASER APPLICATION;  Surgeon: Lazaro Arms, MD;  Location: [facility];  Service: Gynecology;  Laterality: N/A;  . LIVER  BIOPSY N/A 07/22/2016   Procedure: LIVER BIOPSY;  Surgeon: Franky Macho, MD;  Location: [facility];  Service: General;  Laterality: N/A;  . TONSILLECTOMY AND ADENOIDECTOMY Bilateral 08/27/2014   Procedure: BILATERAL TONSILLECTOMY AND ADENOIDECTOMY;  Surgeon: Darletta Moll, MD;  Location: [facility];  Service: ENT;  Laterality: Bilateral;  . TYMPANOSTOMY TUBE PLACEMENT Bilateral     Current Outpatient Medications  Medication Sig Dispense Refill  . calcium carbonate (TUMS EX) 750 MG chewable tablet Chew 1 tablet by mouth daily as needed.     . cetirizine (ZYRTEC) 10 MG tablet Take 10 mg by mouth as needed.     . cholecalciferol (VITAMIN D) 1000 units tablet Take 1,000 Units by mouth daily.    . citalopram (CELEXA) 20 MG tablet TAKE ONE-HALF TABLET BY MOUTH ONCE DAILY 15 tablet 11  . levonorgestrel (MIRENA) 20 MCG/24HR IUD 1 each by Intrauterine route once.    Marland Kitchen ibuprofen (ADVIL,MOTRIN) 800 MG tablet Take 1 tablet (800 mg total) by mouth 3 (three) times daily. (Patient not taking: Reported on 08/09/2017) 21 tablet 0  . thyroid (NP THYROID) 30 MG tablet Take 30 mg by mouth daily before breakfast.      No current facility-administered medications for this visit.     Allergies as of 10/15/2017 - Review Complete 10/15/2017  Allergen Reaction Noted  . Epinephrine Other (See Comments) 06/11/2014  . Lidocaine Other (See Comments) 01/22/2014  . Penicillins Hives 01/24/2013  Family History  Problem Relation Age of Onset  . Hypertension Mother   . Heart disease Maternal Grandmother        pacemaker  . Gallbladder disease Maternal Grandmother   . Hypertension Father   . Heart disease Father        pacemaker  . Cancer Paternal Grandfather        colon cancer  . Stroke Paternal Grandfather   . Macular degeneration Paternal Grandmother   . Leukemia Son   . Asthma Son   . Colon cancer Neg Hx   . Liver disease Neg Hx     Social History   Socioeconomic History  . Marital status:  Married    Spouse name: None  . Number of children: 3  . Years of education: None  . Highest education level: None  Social Needs  . Financial resource strain: None  . Food insecurity - worry: None  . Food insecurity - inability: None  . Transportation needs - medical: None  . Transportation needs - non-medical: None  Occupational History  . None  Tobacco Use  . Smoking status: Never Smoker  . Smokeless tobacco: Never Used  Substance and Sexual Activity  . Alcohol use: No  . Drug use: No  . Sexual activity: Yes    Birth control/protection: IUD  Other Topics Concern  . None  Social History Narrative  . None    Review of Systems: Gen: Denies fever, chills, anorexia. Denies fatigue, weakness, weight loss.  CV: Denies chest pain, palpitations, syncope, peripheral edema, and claudication. Resp: Denies dyspnea at rest, cough, wheezing, coughing up blood, and pleurisy. GI:see HPI  Derm: Denies rash, itching, dry skin Psych: Denies depression, anxiety, memory loss, confusion. No homicidal or suicidal ideation.  Heme: Denies bruising, bleeding, and enlarged lymph nodes.  Physical Exam: BP 110/66   Pulse 71   Temp (!) 97 F (36.1 C) (Oral)   Ht 5\' 5"  (1.651 m)   Wt 186 lb (84.4 kg)   BMI 30.95 kg/m  General:   Alert and oriented. No distress noted. Pleasant and cooperative.  Head:  Normocephalic and atraumatic. Eyes:  Conjuctiva clear without scleral icterus. Mouth:  Oral mucosa pink and moist. Good dentition. No lesions. Abdomen:  +BS, soft,mild TTP LUQ and non-distended. No rebound or guarding. No HSM or masses noted. Msk:  Symmetrical without gross deformities. Normal posture. Extremities:  Without edema. Neurologic:  Alert and  oriented x4 Psych:  Alert and cooperative. Normal mood and affect.

## 2017-10-15 NOTE — Assessment & Plan Note (Signed)
37 year old female with chest discomfort, epigastric pain, associated belching and nausea. Likely GERD-related symptoms. Uses Ibuprofen sparingly. Currently not on a PPI. Gallbladder absent. Will start Protonix once daily, follow GERD diet, and consider endoscopy if no improvement. As of note, she has already been evaluated by cardiology in the past for these symptoms.

## 2017-10-15 NOTE — Assessment & Plan Note (Signed)
History of chronically elevated transaminases, with prior Hep B and C serologies negative. ASMA elevated, with liver biopsy at time of cholecystectomy revealing non-specific hepatitis. Felt to be secondary to med effect or mild steatohepatitis. Needs updated LFTs now. If remains elevated, would pursue extensive serologies.

## 2017-10-15 NOTE — Patient Instructions (Addendum)
I feel like the discomfort you are having is related to reflux. I have attached a diet sheet here and also sent a medication called Protonix to your pharmacy. Take this 30 minutes before breakfast daily. It can take up to 14 days to take full effect. Let me know in about 10 days how you are. We may need to do an upper endoscopy.  I would like to check your liver numbers again.   Keep me updated with how you are! It was so good to catch up with you!   Food Choices for Gastroesophageal Reflux Disease, Adult When you have gastroesophageal reflux disease (GERD), the foods you eat and your eating habits are very important. Choosing the right foods can help ease the discomfort of GERD. Consider working with a diet and nutrition specialist (dietitian) to help you make healthy food choices. What general guidelines should I follow? Eating plan  Choose healthy foods low in fat, such as fruits, vegetables, whole grains, low-fat dairy products, and lean meat, fish, and poultry.  Eat frequent, small meals instead of three large meals each day. Eat your meals slowly, in a relaxed setting. Avoid bending over or lying down until 2-3 hours after eating.  Limit high-fat foods such as fatty meats or fried foods.  Limit your intake of oils, butter, and shortening to less than 8 teaspoons each day.  Avoid the following: ? Foods that cause symptoms. These may be different for different people. Keep a food diary to keep track of foods that cause symptoms. ? Alcohol. ? Drinking large amounts of liquid with meals. ? Eating meals during the 2-3 hours before bed.  Cook foods using methods other than frying. This may include baking, grilling, or broiling. Lifestyle   Maintain a healthy weight. Ask your health care provider what weight is healthy for you. If you need to lose weight, work with your health care provider to do so safely.  Exercise for at least 30 minutes on 5 or more days each week, or as told by  your health care provider.  Avoid wearing clothes that fit tightly around your waist and chest.  Do not use any products that contain nicotine or tobacco, such as cigarettes and e-cigarettes. If you need help quitting, ask your health care provider.  Sleep with the head of your bed raised. Use a wedge under the mattress or blocks under the bed frame to raise the head of the bed. What foods are not recommended? The items listed may not be a complete list. Talk with your dietitian about what dietary choices are best for you. Grains Pastries or quick breads with added fat. [place] toast. Vegetables Deep fried vegetables. [place] fries. Any vegetables prepared with added fat. Any vegetables that cause symptoms. For some people this may include tomatoes and tomato products, chili peppers, onions and garlic, and horseradish. Fruits Any fruits prepared with added fat. Any fruits that cause symptoms. For some people this may include citrus fruits, such as oranges, grapefruit, pineapple, and lemons. Meats and other protein foods High-fat meats, such as fatty beef or pork, hot dogs, ribs, ham, sausage, salami and bacon. Fried meat or protein, including fried fish and fried chicken. Nuts and nut butters. Dairy Whole milk and chocolate milk. Sour cream. Cream. Ice cream. Cream cheese. Milk shakes. Beverages Coffee and tea, with or without caffeine. Carbonated beverages. Sodas. Energy drinks. Fruit juice made with acidic fruits (such as orange or grapefruit). Tomato juice. Alcoholic drinks. Fats and oils Butter. Margarine.  Shortening. Ghee. Sweets and desserts Chocolate and cocoa. Donuts. Seasoning and other foods Pepper. Peppermint and spearmint. Any condiments, herbs, or seasonings that cause symptoms. For some people, this may include curry, hot sauce, or vinegar-based salad dressings. Summary  When you have gastroesophageal reflux disease (GERD), food and lifestyle choices are very important to  help ease the discomfort of GERD.  Eat frequent, small meals instead of three large meals each day. Eat your meals slowly, in a relaxed setting. Avoid bending over or lying down until 2-3 hours after eating.  Limit high-fat foods such as fatty meat or fried foods. This information is not intended to replace advice given to you by your health care provider. Make sure you discuss any questions you have with your health care provider. Document Released: 09/07/2005 Document Revised: 09/08/2016 Document Reviewed: 09/08/2016 Elsevier Interactive Patient Education  [employer].

## 2017-10-18 NOTE — Progress Notes (Signed)
TRANSAMINITIS LIKELY DUE TO BMI > 30. OFFER PT REPEAT ASMA/IgS/ANA AND LIVER BIOPSY OR SHE COULD LOSE 15 LBS AND HAVE LIVER PANEL REPEATED IN 6 MOS.

## 2017-10-18 NOTE — Progress Notes (Signed)
REVIEWED-NO ADDITIONAL RECOMMENDATIONS. WEIGHT UP 4 LBS SINCE 2017.

## 2017-10-18 NOTE — Progress Notes (Signed)
cc'd to pcp 

## 2017-10-19 ENCOUNTER — Other Ambulatory Visit: Payer: Self-pay

## 2017-10-19 ENCOUNTER — Encounter: Payer: Self-pay | Admitting: Gastroenterology

## 2017-10-19 DIAGNOSIS — R7401 Elevation of levels of liver transaminase levels: Secondary | ICD-10-CM

## 2017-10-19 DIAGNOSIS — R740 Nonspecific elevation of levels of transaminase and lactic acid dehydrogenase [LDH]: Principal | ICD-10-CM

## 2017-10-19 DIAGNOSIS — R7989 Other specified abnormal findings of blood chemistry: Secondary | ICD-10-CM

## 2017-10-19 DIAGNOSIS — R945 Abnormal results of liver function studies: Principal | ICD-10-CM

## 2017-10-19 NOTE — Progress Notes (Signed)
Pt is aware the lab orders have been entered. Ok to schedule the US.

## 2017-10-19 NOTE — Progress Notes (Signed)
Heather [name], I spoke to pt and she is aware. She has sent you a message and would like to discuss with you first.

## 2017-10-19 NOTE — Progress Notes (Signed)
Talyah: Dr. Darrick PennaFields reviewed your labs as well. You have a bump in your AST and ALT. This could simply be related to a fatty liver but unable to exclude other reasons. We have two options: 1. Update labs (checking for autoimmune hepatitis, etc) and liver biopsy or #2: continue to work hard on weight loss and recheck in 6 months. Let me know which route you want to take! Tyler Aas([name], I'm copying to you just in case)

## 2017-10-19 NOTE — Progress Notes (Signed)
Heather [name]: let's get an US abdomen complete. Also, we need to order a Hep C antibody, Hep B surface antigen, Hep B core antibody, AMA, ASMA, ANA, immunoglobulins (IgG, IgA, IgM), iron, ferritin, TIBC. She will have blood work done the same day of ultrasound. RGA: please order US abdomen complete, dx: elevated transaminases.

## 2017-10-20 ENCOUNTER — Other Ambulatory Visit: Payer: Self-pay

## 2017-10-20 DIAGNOSIS — R7989 Other specified abnormal findings of blood chemistry: Secondary | ICD-10-CM

## 2017-10-20 DIAGNOSIS — R945 Abnormal results of liver function studies: Principal | ICD-10-CM

## 2017-10-20 NOTE — Progress Notes (Signed)
Heather [name]: can we add a repeat HFP to labs for tomorrow?

## 2017-10-20 NOTE — Progress Notes (Signed)
HFP has been added and released.

## 2017-10-21 ENCOUNTER — Other Ambulatory Visit: Payer: Self-pay

## 2017-10-21 ENCOUNTER — Telehealth: Payer: Self-pay | Admitting: Gastroenterology

## 2017-10-21 ENCOUNTER — Ambulatory Visit (HOSPITAL_COMMUNITY)
Admission: RE | Admit: 2017-10-21 | Discharge: 2017-10-21 | Disposition: A | Discharge status: Home / Self Care | Payer: Commercial Managed Care - PPO | Source: Ambulatory Visit | Attending: Gastroenterology | Admitting: Gastroenterology

## 2017-10-21 DIAGNOSIS — R740 Nonspecific elevation of levels of transaminase and lactic acid dehydrogenase [LDH]: Secondary | ICD-10-CM | POA: Insufficient documentation

## 2017-10-21 DIAGNOSIS — Z9049 Acquired absence of other specified parts of digestive tract: Secondary | ICD-10-CM | POA: Diagnosis not present

## 2017-10-21 DIAGNOSIS — R7989 Other specified abnormal findings of blood chemistry: Secondary | ICD-10-CM

## 2017-10-21 DIAGNOSIS — R7401 Elevation of levels of liver transaminase levels: Secondary | ICD-10-CM

## 2017-10-21 DIAGNOSIS — R945 Abnormal results of liver function studies: Principal | ICD-10-CM

## 2017-10-21 LAB — HEPATIC FUNCTION PANEL
AG RATIO: 1.6 (calc) (ref 1.0–2.5)
ALKALINE PHOSPHATASE (APISO): 106 U/L (ref 33–115)
ALT: 102 U/L — ABNORMAL HIGH (ref 6–29)
AST: 29 U/L (ref 10–30)
Albumin: 4.6 g/dL (ref 3.6–5.1)
BILIRUBIN INDIRECT: 1 mg/dL (ref 0.2–1.2)
Bilirubin, Direct: 0.2 mg/dL (ref 0.0–0.2)
Globulin: 2.8 g/dL (calc) (ref 1.9–3.7)
TOTAL PROTEIN: 7.4 g/dL (ref 6.1–8.1)
Total Bilirubin: 1.2 mg/dL (ref 0.2–1.2)

## 2017-10-21 NOTE — Telephone Encounter (Signed)
[name]:   Heather DikeJennifer stated that the lab told her they only had one lab over at [facility]. She is going back on Monday. Can we find out what they have? Not sure why they can't see all of the orders. From what it sounds like, they only drew an HFP today.

## 2017-10-21 NOTE — Telephone Encounter (Signed)
Thanks, [name]! I told her if any further issues to have the lab call us while she is there.

## 2017-10-21 NOTE — Telephone Encounter (Signed)
Noted  

## 2017-10-21 NOTE — Telephone Encounter (Signed)
I had forgot to release those orders. I called pt and told her I was so sorry, but I did not know why they did not call me from the lab. She said no problem, she will go back on Monday to have those done.

## 2017-10-26 ENCOUNTER — Telehealth: Payer: Self-pay | Admitting: Gastroenterology

## 2017-10-26 ENCOUNTER — Other Ambulatory Visit: Payer: Self-pay

## 2017-10-26 DIAGNOSIS — R945 Abnormal results of liver function studies: Principal | ICD-10-CM

## 2017-10-26 DIAGNOSIS — R7989 Other specified abnormal findings of blood chemistry: Secondary | ICD-10-CM

## 2017-10-26 NOTE — Telephone Encounter (Signed)
[facility] and [facility] in [facility] and the labs have been added.  They will let us know if there is a problem.

## 2017-10-26 NOTE — Telephone Encounter (Signed)
[name]: can you check with the lab to see why the other orders from last week do not look to be in process? AMA< ANA< ASMA, HEp B serologies, Hep C. Thanks! She went yesterday, so I want to make sure we can add these on if at all possible. This is her second time to lab.

## 2017-10-28 LAB — TEST AUTHORIZATION

## 2017-10-28 LAB — HEPATITIS C ANTIBODY
Hepatitis C Ab: NONREACTIVE
SIGNAL TO CUT-OFF: 0.04 (ref <1.00)

## 2017-10-28 LAB — ANTI-SMOOTH MUSCLE ANTIBODY, IGG: ACTIN (SMOOTH MUSCLE) ANTIBODY (IGG): 35 U — AB (ref <20)

## 2017-10-28 LAB — IRON,TIBC AND FERRITIN PANEL
%SAT: 26 % (calc) (ref 11–50)
FERRITIN: 87 ng/mL (ref 10–154)
IRON: 92 ug/dL (ref 40–190)
TIBC: 354 mcg/dL (calc) (ref 250–450)

## 2017-10-28 LAB — HEPATITIS B CORE ANTIBODY, TOTAL: Hep B Core Total Ab: NONREACTIVE

## 2017-10-28 LAB — IGG, IGA, IGM
IGG (IMMUNOGLOBIN G), SERUM: 981 mg/dL (ref 694–1618)
IGM, SERUM: 136 mg/dL (ref 48–271)
Immunoglobulin A: 123 mg/dL (ref 81–463)

## 2017-10-28 LAB — ANA: ANA: NEGATIVE

## 2017-10-28 LAB — HEPATITIS B SURFACE ANTIGEN: Hepatitis B Surface Ag: NONREACTIVE

## 2017-10-28 LAB — MITOCHONDRIAL ANTIBODIES

## 2017-11-02 NOTE — Progress Notes (Signed)
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 At this point, we could pursue liver biopsy. It may be helpful to do this to get a good tissue sample. The other option is weight loss, exercise, and recheck liver numbers in 3 months or so. Let me know what you would like to pursue!

## 2017-11-23 ENCOUNTER — Telehealth: Payer: Self-pay | Admitting: Cardiovascular Disease

## 2017-11-23 ENCOUNTER — Encounter: Payer: Self-pay | Admitting: *Deleted

## 2017-11-23 ENCOUNTER — Other Ambulatory Visit: Payer: Self-pay

## 2017-11-23 ENCOUNTER — Telehealth: Payer: Self-pay | Admitting: Gastroenterology

## 2017-11-23 DIAGNOSIS — R945 Abnormal results of liver function studies: Principal | ICD-10-CM

## 2017-11-23 DIAGNOSIS — R7989 Other specified abnormal findings of blood chemistry: Secondary | ICD-10-CM

## 2017-11-23 NOTE — Telephone Encounter (Signed)
Heather [name]: patient needs a repeat HFP in 3 months. Thanks! She is aware of this plan instead of waiting 6 months as we had considered earlier.

## 2017-11-23 NOTE — Telephone Encounter (Signed)
Patient notified and will FU in office tomorrow

## 2017-11-23 NOTE — Telephone Encounter (Signed)
Lab orders on file for 02/23/2018.

## 2017-11-23 NOTE — Telephone Encounter (Signed)
Per phone call from pt--last night she was having pain in jaw, chest pain that went into left arm. Scheduled her to see Dr. Purvis SheffieldKoneswaran tomorrow, please give her a call @ [phone]

## 2017-11-23 NOTE — Telephone Encounter (Signed)
I will await PCP evaluation. She has a low pretest probability for ischemic heart disease.

## 2017-11-23 NOTE — Telephone Encounter (Signed)
Spoke with pt who states that while at neighbors house she started to have left jaw pain. She describes it as "biting on a bagel". Pt states she was sitting with her husband during his lunch break eating a sub and started to feel a "hot sensation in the chest like when you have a CT scan". Pt states she then had Lt chest and arm pain. She went home to rest for the rest of the night. This morning when she woke both arms were sore as if she had been working out all day. Pt has an appt. With PCP today at 11:30. Please advise.

## 2017-11-24 ENCOUNTER — Ambulatory Visit: Payer: Commercial Managed Care - PPO | Admitting: Cardiovascular Disease

## 2017-11-24 ENCOUNTER — Encounter: Payer: Self-pay | Admitting: Cardiovascular Disease

## 2017-11-24 VITALS — BP 98/60 | HR 61 | Ht 65.0 in | Wt 187.0 lb

## 2017-11-24 DIAGNOSIS — R079 Chest pain, unspecified: Secondary | ICD-10-CM

## 2017-11-24 DIAGNOSIS — Z7182 Exercise counseling: Secondary | ICD-10-CM | POA: Diagnosis not present

## 2017-11-24 DIAGNOSIS — Z713 Dietary counseling and surveillance: Secondary | ICD-10-CM

## 2017-11-24 DIAGNOSIS — K219 Gastro-esophageal reflux disease without esophagitis: Secondary | ICD-10-CM | POA: Diagnosis not present

## 2017-11-24 NOTE — Patient Instructions (Signed)
Medication Instructions:  Your physician recommends that you continue on your current medications as directed. Please refer to the Current Medication list given to you today.   Labwork: NONE   Testing/Procedures: NONE   Follow-Up: Your physician recommends that you schedule a follow-up appointment AS Needed    Any Other Special Instructions Will Be Listed Below (If Applicable).     If you need a refill on your cardiac medications before your next appointment, please call your pharmacy.  Thank you for choosing [facility]!
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
## 2017-12-29 DIAGNOSIS — Z029 Encounter for administrative examinations, unspecified: Secondary | ICD-10-CM

## 2018-02-02 ENCOUNTER — Other Ambulatory Visit: Payer: Self-pay

## 2018-02-02 DIAGNOSIS — R945 Abnormal results of liver function studies: Principal | ICD-10-CM

## 2018-02-02 DIAGNOSIS — R7989 Other specified abnormal findings of blood chemistry: Secondary | ICD-10-CM

## 2018-02-23 ENCOUNTER — Other Ambulatory Visit: Payer: Self-pay | Admitting: Adult Health

## 2018-03-31 ENCOUNTER — Ambulatory Visit: Payer: Commercial Managed Care - PPO | Admitting: Obstetrics & Gynecology

## 2018-03-31 ENCOUNTER — Encounter: Payer: Self-pay | Admitting: Obstetrics & Gynecology

## 2018-03-31 ENCOUNTER — Other Ambulatory Visit (HOSPITAL_COMMUNITY)
Admission: RE | Admit: 2018-03-31 | Discharge: 2018-03-31 | Disposition: A | Discharge status: Home / Self Care | Payer: Commercial Managed Care - PPO | Source: Ambulatory Visit | Attending: Obstetrics & Gynecology | Admitting: Obstetrics & Gynecology

## 2018-03-31 VITALS — BP 109/65 | HR 69 | Ht 65.0 in | Wt 189.0 lb

## 2018-03-31 DIAGNOSIS — Z1151 Encounter for screening for human papillomavirus (HPV): Secondary | ICD-10-CM | POA: Diagnosis not present

## 2018-03-31 DIAGNOSIS — D060 Carcinoma in situ of endocervix: Secondary | ICD-10-CM

## 2018-03-31 DIAGNOSIS — Z975 Presence of (intrauterine) contraceptive device: Secondary | ICD-10-CM | POA: Insufficient documentation

## 2018-03-31 DIAGNOSIS — Z86001 Personal history of in-situ neoplasm of cervix uteri: Secondary | ICD-10-CM | POA: Diagnosis present

## 2018-03-31 NOTE — Progress Notes (Signed)
Chief Complaint  Patient presents with  . Gynecologic Exam      37 y.o. G3P2003 No LMP recorded. (Menstrual status: IUD). The current method of family planning is IUD.  Outpatient Encounter Medications as of 03/31/2018  Medication Sig Note  . calcium carbonate (TUMS EX) 750 MG chewable tablet Chew 1 tablet by mouth daily as needed.    . cetirizine (ZYRTEC) 10 MG tablet Take 10 mg by mouth as needed.    . cholecalciferol (VITAMIN D) 1000 units tablet Take 1,000 Units by mouth daily.   . citalopram (CELEXA) 20 MG tablet TAKE 1/2 (ONE-HALF) TABLET BY MOUTH ONCE DAILY   . levonorgestrel (MIRENA) 20 MCG/24HR IUD 1 each by Intrauterine route once. 07/14/2016: Implanted 10/2015  . thyroid (NP THYROID) 30 MG tablet Take 45 mg by mouth daily before breakfast.  09/09/2016: Received from: [facility]  . pantoprazole (PROTONIX) 40 MG tablet Take 1 tablet (40 mg total) by mouth daily. Take 30 minutes before breakfast (Patient not taking: Reported on 03/31/2018)    No facility-administered encounter medications on file as of 03/31/2018.     Subjective Follow up Pap #3/4 planned s/p laser ablation for CIS No complaints Past Medical History:  Diagnosis Date  . Abnormal Pap smear   . Adenotonsillar hypertrophy    denies snoring during sleep or apnea  . Anxiety   . Complication of anesthesia    PT doesnt want any heavy narcotics  . GERD (gastroesophageal reflux disease)   . HPV (human papilloma virus) infection   . IUD (intrauterine device) in place   . Palpitations   . Panic attack   . RUQ pain 01/02/2016  . Strep throat    to start antibiotic 08/20/2014    Past Surgical History:  Procedure Laterality Date  . CERVICAL CONIZATION W/BX N/A 08/27/2014   Procedure: CONIZATION CERVIX ;  Surgeon: Lazaro Arms, MD;  Location: [facility];  Service: Gynecology;  Laterality: N/A;  Laser conization and ablation of cervix  . CHOLECYSTECTOMY N/A  07/22/2016   Procedure: LAPAROSCOPIC CHOLECYSTECTOMY;  Surgeon: Franky Macho, MD;  Location: [facility];  Service: General;  Laterality: N/A;  . CO2 LASER APPLICATION N/A 08/27/2014   Procedure: CO2 LASER APPLICATION;  Surgeon: Lazaro Arms, MD;  Location: [facility];  Service: Gynecology;  Laterality: N/A;  . LIVER BIOPSY N/A 07/22/2016   Procedure: LIVER BIOPSY;  Surgeon: Franky Macho, MD;  Location: [facility];  Service: General;  Laterality: N/A;  . TONSILLECTOMY AND ADENOIDECTOMY Bilateral 08/27/2014   Procedure: BILATERAL TONSILLECTOMY AND ADENOIDECTOMY;  Surgeon: Darletta Moll, MD;  Location: [facility];  Service: ENT;  Laterality: Bilateral;  . TYMPANOSTOMY TUBE PLACEMENT Bilateral     OB History    Gravida  3   Para  3   Term  2   Preterm      AB      Living  3     SAB      TAB      Ectopic      Multiple      Live Births  2           Allergies  Allergen Reactions  . Epinephrine Other (See Comments)    DUE TO PALPITATIONS  . Lidocaine Other (See Comments)    TACHYCARDIA  . Penicillins Hives    AS A CHILD - STATES CAN TAKE AMOXICILLIN Has patient had a  PCN reaction causing immediate rash, facial/tongue/throat swelling, SOB or lightheadedness with hypotension: Yes Has patient had a PCN reaction causing severe rash involving mucus membranes or skin necrosis: Yes Has patient had a PCN reaction that required hospitalization Unknown Has patient had a PCN reaction occurring within the last 10 years: No If all of the above answers are "NO", then may proceed with Cephalosporin use.     Social History   Socioeconomic History  . Marital status: Married    Spouse name: Not on file  . Number of children: 3  . Years of education: Not on file  . Highest education level: Not on file  Occupational History  . Not on file  Social Needs  . Financial resource strain: Not on file  . Food insecurity:    Worry: Not on file    Inability: Not on  file  . Transportation needs:    Medical: Not on file    Non-medical: Not on file  Tobacco Use  . Smoking status: Never Smoker  . Smokeless tobacco: Never Used  Substance and Sexual Activity  . Alcohol use: No  . Drug use: No  . Sexual activity: Yes    Birth control/protection: IUD  Lifestyle  . Physical activity:    Days per week: Not on file    Minutes per session: Not on file  . Stress: Not on file  Relationships  . Social connections:    Talks on phone: Not on file    Gets together: Not on file    Attends religious service: Not on file    Active member of club or organization: Not on file    Attends meetings of clubs or organizations: Not on file    Relationship status: Not on file  Other Topics Concern  . Not on file  Social History Narrative  . Not on file    Family History  Problem Relation Age of Onset  . Hypertension Mother   . Heart disease Maternal Grandmother        pacemaker  . Gallbladder disease Maternal Grandmother   . Hypertension Father   . Heart disease Father        pacemaker  . Cancer Paternal Grandfather        colon cancer  . Stroke Paternal Grandfather   . Macular degeneration Paternal Grandmother   . Leukemia Son   . Asthma Son   . Colon cancer Neg Hx   . Liver disease Neg Hx     Medications:       Current Outpatient Medications:  .  calcium carbonate (TUMS EX) 750 MG chewable tablet, Chew 1 tablet by mouth daily as needed. , Disp: , Rfl:  .  cetirizine (ZYRTEC) 10 MG tablet, Take 10 mg by mouth as needed. , Disp: , Rfl:  .  cholecalciferol (VITAMIN D) 1000 units tablet, Take 1,000 Units by mouth daily., Disp: , Rfl:  .  citalopram (CELEXA) 20 MG tablet, TAKE 1/2 (ONE-HALF) TABLET BY MOUTH ONCE DAILY, Disp: 15 tablet, Rfl: 11 .  levonorgestrel (MIRENA) 20 MCG/24HR IUD, 1 each by Intrauterine route once., Disp: , Rfl:  .  thyroid (NP THYROID) 30 MG tablet, Take 45 mg by mouth daily before breakfast. , Disp: , Rfl:  .  pantoprazole  (PROTONIX) 40 MG tablet, Take 1 tablet (40 mg total) by mouth daily. Take 30 minutes before breakfast (Patient not taking: Reported on 03/31/2018), Disp: 30 tablet, Rfl: 3  Objective Blood pressure 109/65, pulse 69, height 5'  5" (1.651 m), weight 189 lb (85.7 kg).  General WDWN female NAD Vulva:  normal appearing vulva with no masses, tenderness or lesions Vagina:  normal mucosa, no discharge Cervix:  no cervical motion tenderness and no lesions Pap done Uterus:  normal size, contour, position, consistency, mobility, non-tender Adnexa: ovaries:,     Pertinent ROS   Labs or studies     Impression Diagnoses this Encounter::   ICD-10-CM   1. Well woman exam with routine gynecological exam Z01.419   2. 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   Labs or Scans Ordered: No orders of the defined types were placed in this encounter.   Management:: Repeat Pap + yearly   Follow up Return in about 1 year (around 04/01/2019) for yearly, with Dr Despina Hidden.        Face to face time:  10 minutes  Greater than 50% of the visit time was spent in counseling and coordination of care with the patient.  The summary and outline of the counseling and care coordination is summarized in the note above.   All questions were answered.      All questions were answered.

## 2018-04-01 LAB — CYTOLOGY - PAP
CHLAMYDIA, DNA PROBE: NEGATIVE
Diagnosis: NEGATIVE
HPV: NOT DETECTED
NEISSERIA GONORRHEA: NEGATIVE

## 2018-04-13 ENCOUNTER — Encounter: Payer: Self-pay | Admitting: Obstetrics & Gynecology

## 2018-04-29 LAB — HEPATIC FUNCTION PANEL
AG Ratio: 1.6 (calc) (ref 1.0–2.5)
ALKALINE PHOSPHATASE (APISO): 112 U/L (ref 33–115)
ALT: 82 U/L — ABNORMAL HIGH (ref 6–29)
AST: 36 U/L — AB (ref 10–30)
Albumin: 4.2 g/dL (ref 3.6–5.1)
BILIRUBIN DIRECT: 0.2 mg/dL (ref 0.0–0.2)
BILIRUBIN TOTAL: 0.7 mg/dL (ref 0.2–1.2)
Globulin: 2.7 g/dL (calc) (ref 1.9–3.7)
Indirect Bilirubin: 0.5 mg/dL (calc) (ref 0.2–1.2)
Total Protein: 6.9 g/dL (ref 6.1–8.1)

## 2018-04-29 NOTE — Progress Notes (Signed)
Heather [name]: your ALT is better!!! It is improved by over half from 6 months ago. Your AST is only marginally elevated; this is not something to be concerned about and is STILL better from 6 months ago. Let's focus on weight loss, EXERCISE, healthy habits as we have discussed. We can recheck in 6 months!  [name]: please have repeat HFP in 6 mnths.

## 2018-05-02 ENCOUNTER — Other Ambulatory Visit: Payer: Self-pay

## 2018-05-02 DIAGNOSIS — R748 Abnormal levels of other serum enzymes: Secondary | ICD-10-CM

## 2018-05-02 NOTE — Progress Notes (Signed)
Lab orders on file for 10/2018.

## 2018-05-13 ENCOUNTER — Encounter: Payer: Self-pay | Admitting: Obstetrics & Gynecology
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
## 2018-08-11 ENCOUNTER — Other Ambulatory Visit: Payer: Commercial Managed Care - PPO | Admitting: Obstetrics & Gynecology

## 2018-09-14 IMAGING — DX DG CHEST 2V
2 series · 2 of 2 positions shown · non-contrast
Comparison: None.

CLINICAL DATA: Mid left-sided chest pain for 3 days.

EXAM:
CHEST  2 VIEW

[chest pa]
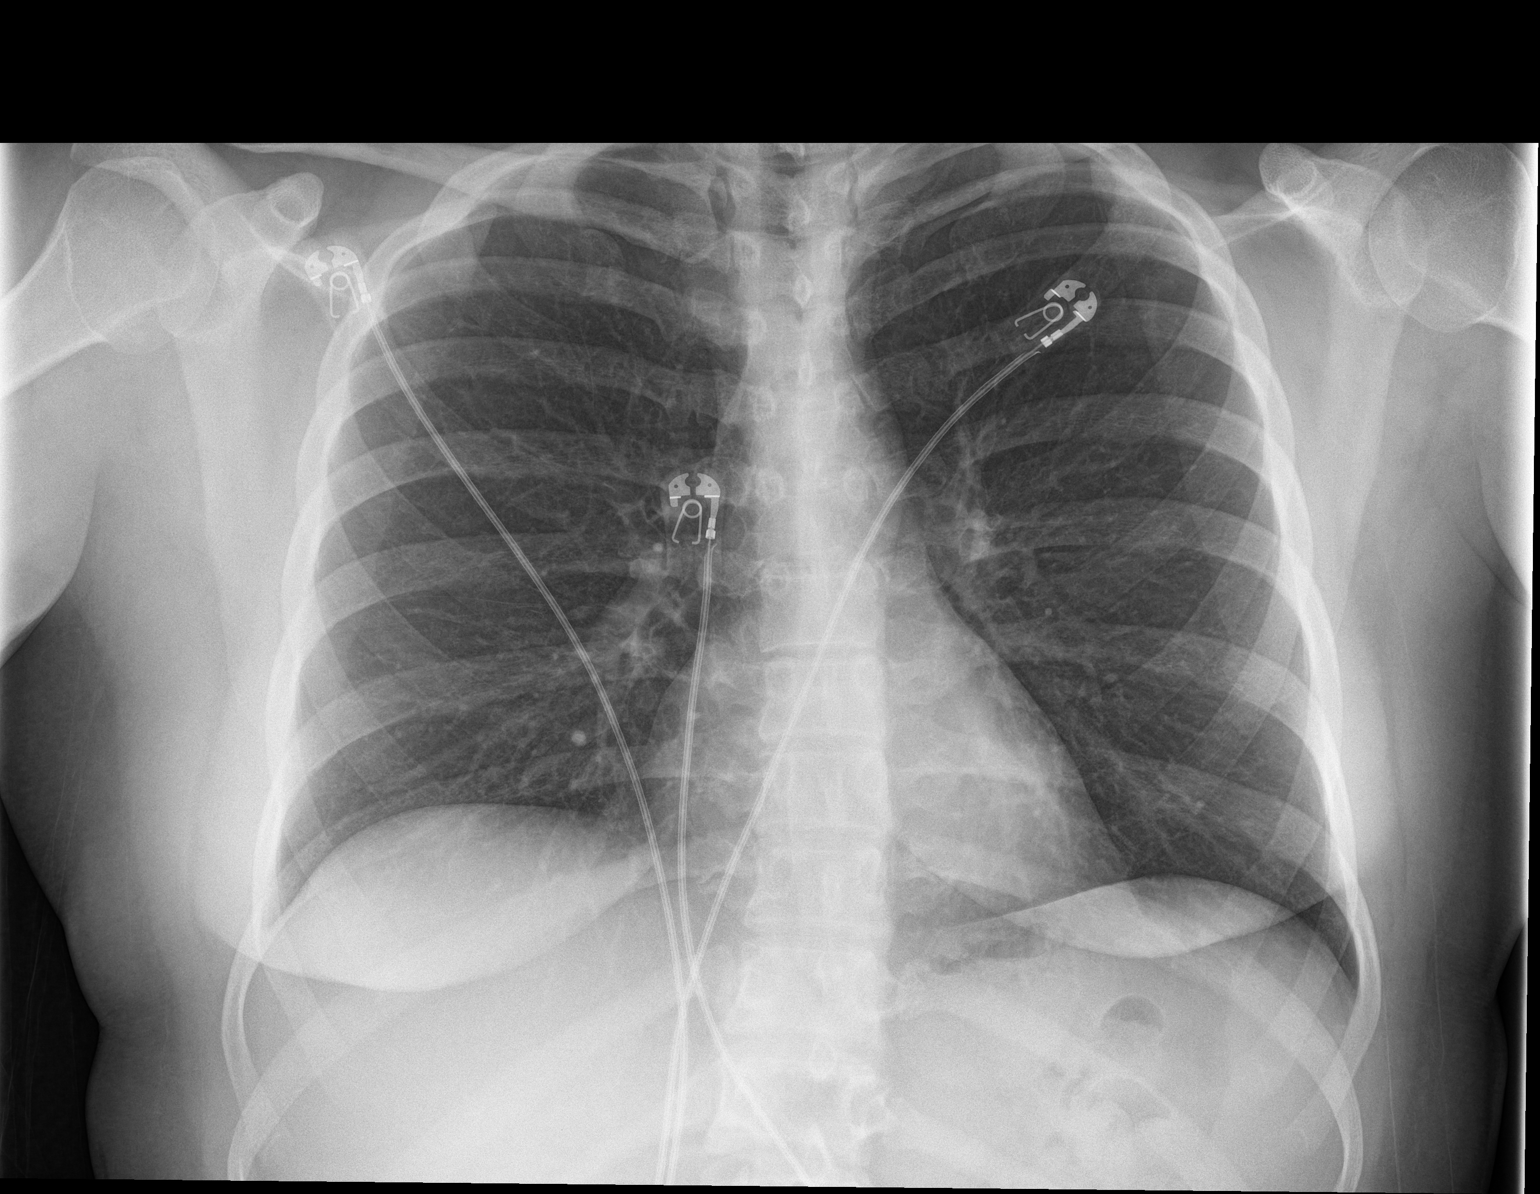

[chest lat]
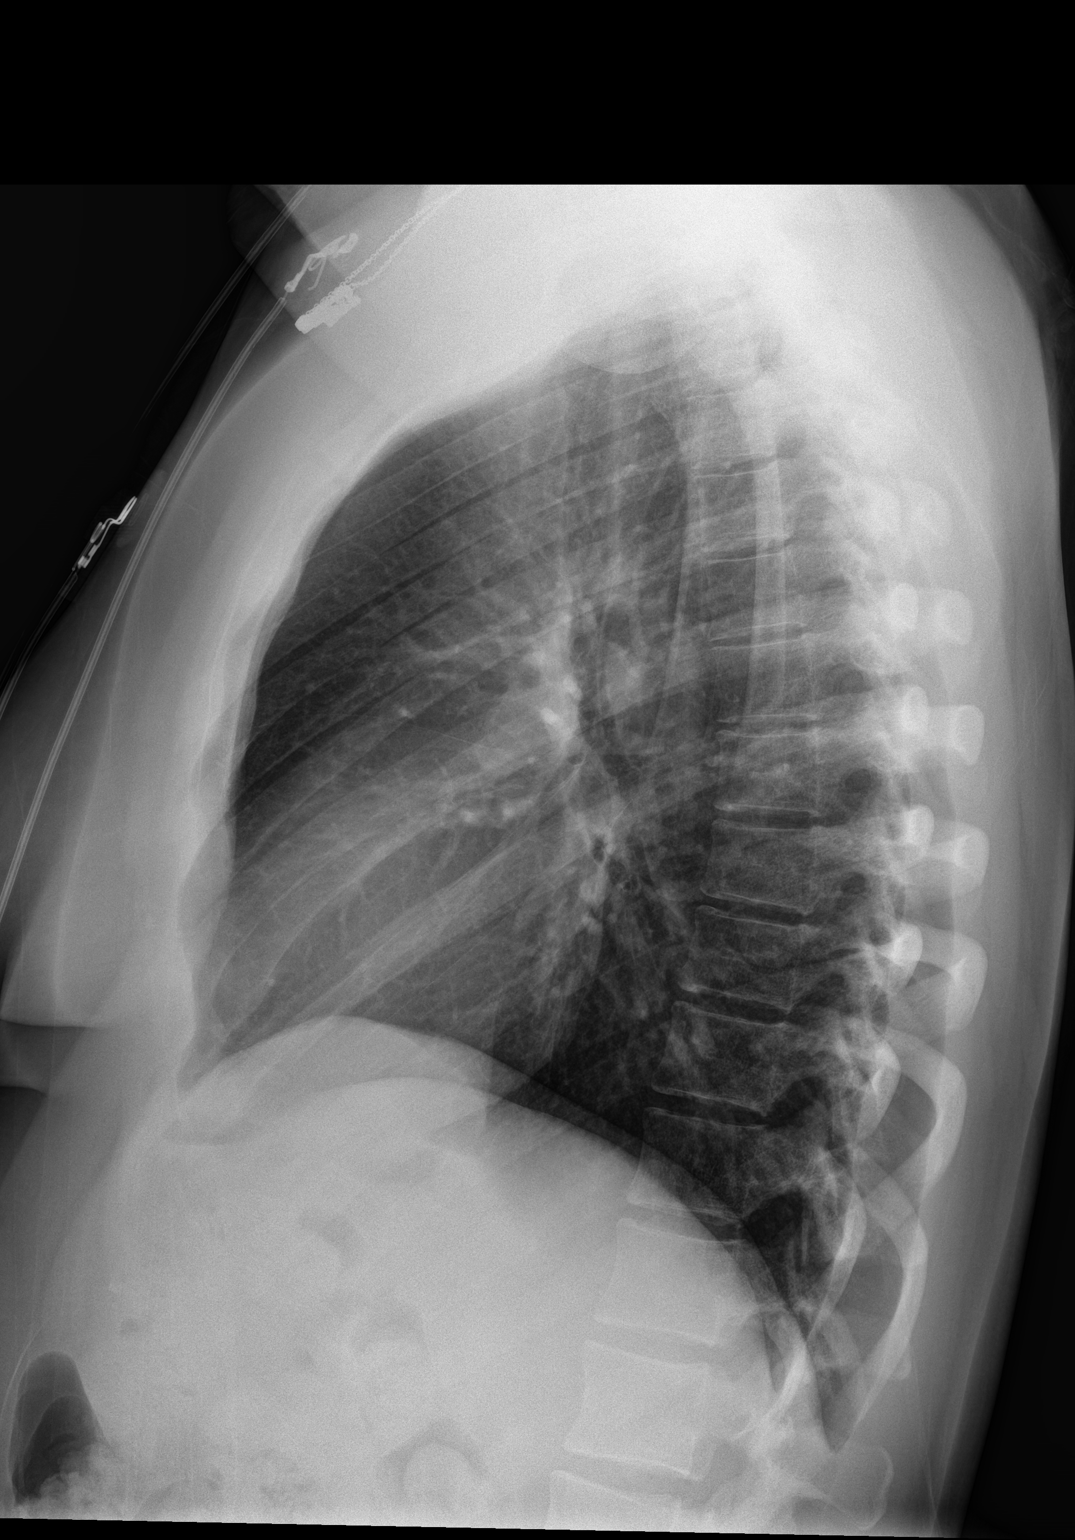

[2 of 2 positions shown; findings below may reference images not displayed]

FINDINGS: The heart size and mediastinal contours are within normal limits.
Both lungs are clear. The visualized skeletal structures are
unremarkable.
IMPRESSION: No active cardiopulmonary disease.

## 2018-10-11 ENCOUNTER — Other Ambulatory Visit: Payer: Self-pay

## 2018-10-11 DIAGNOSIS — R748 Abnormal levels of other serum enzymes: Secondary | ICD-10-CM

## 2018-11-26 LAB — HEPATIC FUNCTION PANEL
AG Ratio: 1.6 (calc) (ref 1.0–2.5)
ALBUMIN MSPROF: 4.4 g/dL (ref 3.6–5.1)
ALT: 58 U/L — ABNORMAL HIGH (ref 6–29)
AST: 33 U/L — ABNORMAL HIGH (ref 10–30)
Alkaline phosphatase (APISO): 98 U/L (ref 31–125)
BILIRUBIN DIRECT: 0.1 mg/dL (ref 0.0–0.2)
BILIRUBIN INDIRECT: 0.7 mg/dL (ref 0.2–1.2)
BILIRUBIN TOTAL: 0.8 mg/dL (ref 0.2–1.2)
GLOBULIN: 2.7 g/dL (ref 1.9–3.7)
Total Protein: 7.1 g/dL (ref 6.1–8.1)

## 2018-11-28 ENCOUNTER — Other Ambulatory Visit: Payer: Self-pay

## 2018-11-28 DIAGNOSIS — R7989 Other specified abnormal findings of blood chemistry: Secondary | ICD-10-CM

## 2018-11-28 DIAGNOSIS — R945 Abnormal results of liver function studies: Principal | ICD-10-CM

## 2018-11-28 NOTE — Progress Notes (Signed)
Lab order on file for 02/28/2019.

## 2018-11-28 NOTE — Progress Notes (Signed)
Hi Heather [name]! Your AST continues to improve and is only marginally above the cut-off of "30". Your ALT has continued to steadily trend down, which is great! How are you doing with exercise, diet, etc? I recommend you continue those healthy diet/behavior changes, and we recheck in 3 months. If liver numbers remain persistently elevated or increase, I recommend a liver biopsy. ([name]: I sent in MyChart, Please put 3 month HFP on file).

## 2019-02-15 ENCOUNTER — Other Ambulatory Visit: Payer: Self-pay

## 2019-02-15 DIAGNOSIS — R7989 Other specified abnormal findings of blood chemistry: Secondary | ICD-10-CM

## 2019-04-04 ENCOUNTER — Telehealth: Payer: Self-pay | Admitting: Gastroenterology

## 2019-04-04 NOTE — Telephone Encounter (Addendum)
Received outside labs dated March 29, 2019.   Tbili 0.5, Alk Phos 87, AST 32 (similar to 4 months ago), and ALT 64 (just marginally increased from 4 months ago but improved from a year ago.   Patient aware. She would like to continue aggressive diet/exercise as she has been doing, with repeat HFP in 3 months.  [name]: please arrange HFP in 3 months. We may need to pursue liver biopsy if persistently elevated. She is wanting to hold off on this currently.

## 2019-04-05 ENCOUNTER — Other Ambulatory Visit: Payer: Self-pay

## 2019-04-05 DIAGNOSIS — R7989 Other specified abnormal findings of blood chemistry: Secondary | ICD-10-CM

## 2019-04-05 NOTE — Telephone Encounter (Signed)
Lab orders on file for 07/06/2019.

## 2019-07-25 ENCOUNTER — Telehealth: Payer: Self-pay | Admitting: Obstetrics & Gynecology

## 2019-07-25 NOTE — Telephone Encounter (Signed)
Patient called, was due a P/P in July of this year.  Will you do her pap?  [phone]

## 2019-07-25 NOTE — Telephone Encounter (Signed)
yes

## 2019-08-08 ENCOUNTER — Other Ambulatory Visit: Payer: Commercial Managed Care - PPO | Admitting: Obstetrics & Gynecology

## 2019-08-11 ENCOUNTER — Other Ambulatory Visit (HOSPITAL_COMMUNITY)
Admission: RE | Admit: 2019-08-11 | Discharge: 2019-08-11 | Disposition: A | Discharge status: Home / Self Care | Payer: BC Managed Care – PPO | Source: Ambulatory Visit | Attending: Obstetrics & Gynecology | Admitting: Obstetrics & Gynecology

## 2019-08-11 ENCOUNTER — Other Ambulatory Visit: Payer: Commercial Managed Care - PPO | Admitting: Obstetrics & Gynecology

## 2019-08-11 ENCOUNTER — Encounter: Payer: Self-pay | Admitting: Obstetrics & Gynecology

## 2019-08-11 ENCOUNTER — Ambulatory Visit (INDEPENDENT_AMBULATORY_CARE_PROVIDER_SITE_OTHER): Payer: BC Managed Care – PPO | Admitting: Obstetrics & Gynecology

## 2019-08-11 ENCOUNTER — Other Ambulatory Visit: Payer: Self-pay

## 2019-08-11 VITALS — BP 108/58 | HR 68 | Ht 65.0 in | Wt 154.0 lb

## 2019-08-11 DIAGNOSIS — Z01419 Encounter for gynecological examination (general) (routine) without abnormal findings: Secondary | ICD-10-CM

## 2019-08-11 DIAGNOSIS — N719 Inflammatory disease of uterus, unspecified: Secondary | ICD-10-CM

## 2019-08-11 DIAGNOSIS — R102 Pelvic and perineal pain: Secondary | ICD-10-CM | POA: Diagnosis not present

## 2019-08-11 LAB — POCT URINALYSIS DIPSTICK OB
Blood, UA: NEGATIVE
Glucose, UA: NEGATIVE
Ketones, UA: NEGATIVE
Leukocytes, UA: NEGATIVE
Nitrite, UA: NEGATIVE
POC,PROTEIN,UA: NEGATIVE

## 2019-08-11 MED ORDER — DOXYCYCLINE HYCLATE 100 MG PO TABS: 100.0000 mg | ORAL_TABLET | Freq: Two times a day (BID) | ORAL | 0 refills | Status: DC

## 2019-08-11 NOTE — Progress Notes (Signed)
Subjective:     Heather [name] is a 38 y.o. female here for a routine exam.  No LMP recorded. (Menstrual status: IUD). [id number] Birth Control Method:  IUD Menstrual Calendar(currently): minimal bleeding with IUD, in for 3 years  Current complaints: midpelvic tenderness for 3 weeks. LMD treated for diverticulitis/PID with rocephin amoxiciliin with no improvement in pain, caused diarrhea.   Current acute medical issues:     Recent Gynecologic History No LMP recorded. (Menstrual status: IUD). Last Pap: 2019,  normal Last mammogram: n/a,    Past Medical History:  Diagnosis Date  . Abnormal Pap smear   . Adenotonsillar hypertrophy    denies snoring during sleep or apnea  . Anxiety   . Complication of anesthesia    PT doesnt want any heavy narcotics  . GERD (gastroesophageal reflux disease)   . HPV (human papilloma virus) infection   . IUD (intrauterine device) in place   . Palpitations   . Panic attack   . RUQ pain 01/02/2016  . Strep throat    to start antibiotic 08/20/2014    Past Surgical History:  Procedure Laterality Date  . CERVICAL CONIZATION W/BX N/A 08/27/2014   Procedure: CONIZATION CERVIX ;  Surgeon: Florian Buff, MD;  Location: [place];  Service: Gynecology;  Laterality: N/A;  Laser conization and ablation of cervix  . CHOLECYSTECTOMY N/A 07/22/2016   Procedure: LAPAROSCOPIC CHOLECYSTECTOMY;  Surgeon: Aviva Signs, MD;  Location: [facility];  Service: General;  Laterality: N/A;  . CO2 LASER APPLICATION N/A 67/11/4191   Procedure: CO2 LASER APPLICATION;  Surgeon: Florian Buff, MD;  Location: [place];  Service: Gynecology;  Laterality: N/A;  . LIVER BIOPSY N/A 07/22/2016   Procedure: LIVER BIOPSY;  Surgeon: Aviva Signs, MD;  Location: [facility];  Service: General;  Laterality: N/A;  . TONSILLECTOMY AND ADENOIDECTOMY Bilateral 08/27/2014   Procedure: BILATERAL TONSILLECTOMY AND ADENOIDECTOMY;  Surgeon: Ascencion Dike, MD;  Location: [place];  Service: ENT;  Laterality: Bilateral;  . TYMPANOSTOMY TUBE PLACEMENT Bilateral     OB History    Gravida  3   Para  3   Term  2   Preterm      AB      Living  3     SAB      TAB      Ectopic      Multiple      Live Births  2           Social History   Socioeconomic History  . Marital status: Married    Spouse name: Not on file  . Number of children: 3  . Years of education: Not on file  . Highest education level: Not on file  Occupational History  . Not on file  Social Needs  . Financial resource strain: Not on file  . Food insecurity    Worry: Not on file    Inability: Not on file  . Transportation needs    Medical: Not on file    Non-medical: Not on file  Tobacco Use  . Smoking status: Never Smoker  . Smokeless tobacco: Never Used  Substance and Sexual Activity  . Alcohol use: No  . Drug use: No  . Sexual activity: Yes    Birth control/protection: I.U.D.  Lifestyle  . Physical activity    Days per week: Not on file    Minutes per session: Not on file  . Stress: Not on  file  Relationships  . Social Musicianconnections    Talks on phone: Not on file    Gets together: Not on file    Attends religious service: Not on file    Active member of club or organization: Not on file    Attends meetings of clubs or organizations: Not on file    Relationship status: Not on file  Other Topics Concern  . Not on file  Social History Narrative  . Not on file    Family History  Problem Relation Age of Onset  . Hypertension Mother   . Heart disease Maternal Grandmother        pacemaker  . Gallbladder disease Maternal Grandmother   . Hypertension Father   . Heart disease Father        pacemaker  . Cancer Paternal Grandfather        colon cancer  . Stroke Paternal Grandfather   . Macular degeneration Paternal Grandmother   . Leukemia Son   . Asthma Son   . Colon cancer Neg Hx   . Liver disease Neg Hx      Current Outpatient  Medications:  .  cetirizine (ZYRTEC) 10 MG tablet, Take 10 mg by mouth as needed. , Disp: , Rfl:  .  cholecalciferol (VITAMIN D) 1000 units tablet, Take 1,000 Units by mouth daily., Disp: , Rfl:  .  citalopram (CELEXA) 20 MG tablet, TAKE 1/2 (ONE-HALF) TABLET BY MOUTH ONCE DAILY, Disp: 15 tablet, Rfl: 11 .  levonorgestrel (MIRENA) 20 MCG/24HR IUD, 1 each by Intrauterine route once., Disp: , Rfl:  .  thyroid (NP THYROID) 30 MG tablet, Take 45 mg by mouth daily before breakfast. , Disp: , Rfl:  .  calcium carbonate (TUMS EX) 750 MG chewable tablet, Chew 1 tablet by mouth daily as needed. , Disp: , Rfl:  .  doxycycline (VIBRA-TABS) 100 MG tablet, Take 1 tablet (100 mg total) by mouth 2 (two) times daily., Disp: 20 tablet, Rfl: 0 .  famotidine (PEPCID) 20 MG tablet, Take by mouth., Disp: , Rfl:  .  Omega-3 Fatty Acids (FISH OIL) 1000 MG CAPS, Fish Oil, Disp: , Rfl:   Review of Systems  Review of Systems  Constitutional: Negative for fever, chills, weight loss, malaise/fatigue and diaphoresis.  HENT: Negative for hearing loss, ear pain, nosebleeds, congestion, sore throat, neck pain, tinnitus and ear discharge.   Eyes: Negative for blurred vision, double vision, photophobia, pain, discharge and redness.  Respiratory: Negative for cough, hemoptysis, sputum production, shortness of breath, wheezing and stridor.   Cardiovascular: Negative for chest pain, palpitations, orthopnea, claudication, leg swelling and PND.  Gastrointestinal: negative for abdominal pain. Negative for heartburn, nausea, vomiting, diarrhea, constipation, blood in stool and melena.  Genitourinary: Negative for dysuria, urgency, frequency, hematuria and flank pain.  Musculoskeletal: Negative for myalgias, back pain, joint pain and falls.  Skin: Negative for itching and rash.  Neurological: Negative for dizziness, tingling, tremors, sensory change, speech change, focal weakness, seizures, loss of consciousness, weakness and  headaches.  Endo/Heme/Allergies: Negative for environmental allergies and polydipsia. Does not bruise/bleed easily.  Psychiatric/Behavioral: Negative for depression, suicidal ideas, hallucinations, memory loss and substance abuse. The patient is not nervous/anxious and does not have insomnia.        Objective:  Blood pressure (!) 108/58, pulse 68, height 5\' 5"  (1.651 m), weight 154 lb (69.9 kg).   Physical Exam  Vitals reviewed. Constitutional: She is oriented to person, place, and time. She appears well-developed and well-nourished.  HENT:  Head: Normocephalic and atraumatic.        Right Ear: External ear normal.  Left Ear: External ear normal.  Nose: Nose normal.  Mouth/Throat: Oropharynx is clear and moist.  Eyes: Conjunctivae and EOM are normal. Pupils are equal, round, and reactive to light. Right eye exhibits no discharge. Left eye exhibits no discharge. No scleral icterus.  Neck: Normal range of motion. Neck supple. No tracheal deviation present. No thyromegaly present.  Cardiovascular: Normal rate, regular rhythm, normal heart sounds and intact distal pulses.  Exam reveals no gallop and no friction rub.   No murmur heard. Respiratory: Effort normal and breath sounds normal. No respiratory distress. She has no wheezes. She has no rales. She exhibits no tenderness.  GI: Soft. Bowel sounds are normal. She exhibits no distension and no mass. There is no tenderness. There is no rebound and no guarding.  Genitourinary:  Breasts no masses skin changes or nipple changes bilaterally      Vulva is normal without lesions Vagina is pink moist without discharge Cervix normal in appearance and pap is done, GC chlamydia done, +CMT, IUD strings are visible Uterus is normal size shape and contour, tender to exam moderately Adnexa is negative with normal sized ovaries   Musculoskeletal: Normal range of motion. She exhibits no edema and no tenderness.  Neurological: She is alert and oriented to  person, place, and time. She has normal reflexes. She displays normal reflexes. No cranial nerve deficit. She exhibits normal muscle tone. Coordination normal.  Skin: Skin is warm and dry. No rash noted. No erythema. No pallor.  Psychiatric: She has a normal mood and affect. Her behavior is normal. Judgment and thought content normal.       Medications Ordered at today's visit: Meds ordered this encounter  Medications  . doxycycline (VIBRA-TABS) 100 MG tablet    Sig: Take 1 tablet (100 mg total) by mouth 2 (two) times daily.    Dispense:  20 tablet    Refill:  0    Other orders placed at today's visit: Orders Placed This Encounter  Procedures  . US Transvaginal Non-OB  . GYN Trans ABD  . POC Urinalysis Dipstick OB      Assessment:     Endometritis.   IUD in proper location  GC/Chlamydia PCR pending Plan:    Contraception: IUD. Follow up in: 3 weeks.     Return in about 3 weeks (around 09/01/2019) for GYN sono, Follow up, with Dr Despina Hidden.

## 2019-08-16 LAB — CYTOLOGY - PAP
Chlamydia: NEGATIVE
Comment: NEGATIVE
Comment: NEGATIVE
Comment: NORMAL
Diagnosis: NEGATIVE
High risk HPV: NEGATIVE
Neisseria Gonorrhea: NEGATIVE

## 2019-08-21 ENCOUNTER — Other Ambulatory Visit: Payer: Self-pay | Admitting: Obstetrics & Gynecology

## 2019-08-21 MED ORDER — CIPROFLOXACIN HCL 500 MG PO TABS: 500.0000 mg | ORAL_TABLET | Freq: Two times a day (BID) | ORAL | 0 refills | Status: DC

## 2019-08-28 ENCOUNTER — Encounter: Payer: Self-pay | Admitting: Gastroenterology

## 2019-08-28 NOTE — Progress Notes (Signed)
HFP reviewed from Oct 2020. Excellent improvement in transaminases through dedicated diet/exercise.  Tbili 1./0, Alk Phos 93, ASt 25, ALT 34.   Please have her repeat this in April 2021.

## 2019-08-29 ENCOUNTER — Other Ambulatory Visit: Payer: Self-pay

## 2019-08-29 DIAGNOSIS — R7989 Other specified abnormal findings of blood chemistry: Secondary | ICD-10-CM

## 2019-08-29 DIAGNOSIS — R945 Abnormal results of liver function studies: Secondary | ICD-10-CM

## 2019-08-29 NOTE — Progress Notes (Signed)
Pt is aware of results and plan.

## 2019-08-29 NOTE — Progress Notes (Signed)
LMOM for a return call. Lab order on file for 12/2019.

## 2019-08-31 ENCOUNTER — Ambulatory Visit (INDEPENDENT_AMBULATORY_CARE_PROVIDER_SITE_OTHER): Payer: BC Managed Care – PPO | Admitting: Obstetrics & Gynecology

## 2019-08-31 ENCOUNTER — Encounter: Payer: Self-pay | Admitting: Obstetrics & Gynecology

## 2019-08-31 ENCOUNTER — Other Ambulatory Visit: Payer: Self-pay

## 2019-08-31 ENCOUNTER — Ambulatory Visit (INDEPENDENT_AMBULATORY_CARE_PROVIDER_SITE_OTHER): Payer: BC Managed Care – PPO

## 2019-08-31 VITALS — BP 116/68 | HR 61 | Ht 65.0 in | Wt 155.5 lb

## 2019-08-31 DIAGNOSIS — R102 Pelvic and perineal pain: Secondary | ICD-10-CM | POA: Diagnosis not present

## 2019-08-31 DIAGNOSIS — T8332XA Displacement of intrauterine contraceptive device, initial encounter: Secondary | ICD-10-CM

## 2019-08-31 DIAGNOSIS — N719 Inflammatory disease of uterus, unspecified: Secondary | ICD-10-CM | POA: Diagnosis not present

## 2019-08-31 MED ORDER — DESOGESTREL-ETHINYL ESTRADIOL 0.15-0.02/0.01 MG (21/5) PO TABS: 1.0000 | ORAL_TABLET | Freq: Every day | ORAL | 11 refills | Status: DC

## 2019-08-31 NOTE — Progress Notes (Signed)
Follow up appointment for results  Chief Complaint  Patient presents with  . discuss [place]    Blood pressure 116/68, pulse 61, height 5\' 5"  (1.651 m), weight 155 lb 8 oz (70.5 kg).  Transvaginal Non-OB  Result Date: 08/31/2019 GYNECOLOGIC SONOGRAM Heather [name] is a 38 y.o. G3P2003 she is here for a pelvic sonogram for pelvic pain and IUD position. Uterus                      9.7 x 3.8 x 5 cm, Total uterine volume 96 cc, homogeneous anteverted uterus,wnl Endometrium          3.5 mm, symmetrical, malpositioned IUD, IUD appears to be angled anterior in the myometrium mid uterus Right ovary             3.3 x 2 x 2.8 cm, wnl Left ovary                2.8 x 1.6 x 2.1 cm, wnl No free fluid Technician Comments: PELVIC 31 TA/TV: homogeneous anteverted uterus,wnl,malpositioned IUD, IUD appears to be angled anterior in the myometrium mid uterus,EEC 3.5 mm,normal ovaries,no free fluid,some discomfort during ultrasound,no free fluid [place] 08/31/2019 11:54 AM Clinical Impression and recommendations: I have reviewed the sonogram results above, combined with the patient's current clinical course, below are my impressions and any appropriate recommendations for management based on the sonographic findings. Uterus is normal Endometrium is normal with left arem of IUD in the myometrium Both ovaries are normal 14/06/2019 08/31/2019 11:58 AM  GYN Trans ABD  Result Date: 08/31/2019 GYNECOLOGIC SONOGRAM Heather [name] is a 38 y.o. G3P2003 she is here for a pelvic sonogram for pelvic pain and IUD position. Uterus                      9.7 x 3.8 x 5 cm, Total uterine volume 96 cc, homogeneous anteverted uterus,wnl Endometrium          3.5 mm, symmetrical, malpositioned IUD, IUD appears to be angled anterior in the myometrium mid uterus Right ovary             3.3 x 2 x 2.8 cm, wnl Left ovary                2.8 x 1.6 x 2.1 cm, wnl No free fluid Technician Comments: PELVIC 31 TA/TV: homogeneous anteverted  uterus,wnl,malpositioned IUD, IUD appears to be angled anterior in the myometrium mid uterus,EEC 3.5 mm,normal ovaries,no free fluid,some discomfort during ultrasound,no free fluid [place] 08/31/2019 11:54 AM Clinical Impression and recommendations: I have reviewed the sonogram results above, combined with the patient's current clinical course, below are my impressions and any appropriate recommendations for management based on the sonographic findings. Uterus is normal Endometrium is normal with left arem of IUD in the myometrium Both ovaries are normal 14/06/2019 08/31/2019 11:58 AM     MEDS ordered this encounter: Meds ordered this encounter  Medications  . desogestrel-ethinyl estradiol (MIRCETTE) 0.15-0.02/0.01 MG (21/5) tablet    Sig: Take 1 tablet by mouth daily.    Dispense:  1 Package    Refill:  11    Orders for this encounter: No orders of the defined types were placed in this encounter.   Impression: 1. Intrauterine device (IUD) migration, initial encounter   2. Pelvic pain    Plan: Begin OCP as BCM  Follow Up: Return if symptoms worsen or  fail to improve.         All questions were answered.  Past Medical History:  Diagnosis Date  . Abnormal Pap smear   . Adenotonsillar hypertrophy    denies snoring during sleep or apnea  . Anxiety   . Complication of anesthesia    PT doesnt want any heavy narcotics  . GERD (gastroesophageal reflux disease)   . HPV (human papilloma virus) infection   . IUD (intrauterine device) in place   . Palpitations   . Panic attack   . RUQ pain 01/02/2016  . Strep throat    to start antibiotic 08/20/2014    Past Surgical History:  Procedure Laterality Date  . CERVICAL CONIZATION W/BX N/A 08/27/2014   Procedure: CONIZATION CERVIX ;  Surgeon: Lazaro ArmsLuther H , MD;  Location: [facility];  Service: Gynecology;  Laterality: N/A;  Laser conization and ablation of cervix  . CHOLECYSTECTOMY N/A 07/22/2016    Procedure: LAPAROSCOPIC CHOLECYSTECTOMY;  Surgeon: Franky MachoMark Jenkins, MD;  Location: [facility];  Service: General;  Laterality: N/A;  . CO2 LASER APPLICATION N/A 08/27/2014   Procedure: CO2 LASER APPLICATION;  Surgeon: Lazaro ArmsLuther H , MD;  Location: [facility];  Service: Gynecology;  Laterality: N/A;  . LIVER BIOPSY N/A 07/22/2016   Procedure: LIVER BIOPSY;  Surgeon: Franky MachoMark Jenkins, MD;  Location: [facility];  Service: General;  Laterality: N/A;  . TONSILLECTOMY AND ADENOIDECTOMY Bilateral 08/27/2014   Procedure: BILATERAL TONSILLECTOMY AND ADENOIDECTOMY;  Surgeon: Darletta MollSui W Teoh, MD;  Location: [facility];  Service: ENT;  Laterality: Bilateral;  . TYMPANOSTOMY TUBE PLACEMENT Bilateral     OB History    Gravida  3   Para  3   Term  2   Preterm      AB      Living  3     SAB      TAB      Ectopic      Multiple      Live Births  2           Allergies  Allergen Reactions  . Epinephrine Other (See Comments)    DUE TO PALPITATIONS  . Lidocaine Other (See Comments)    TACHYCARDIA  . Contrast Media [Iodinated Diagnostic Agents]   . Doxycycline Other (See Comments)    Headaches and anxiety  . Penicillins Hives    AS A CHILD - STATES CAN TAKE AMOXICILLIN Has patient had a PCN reaction causing immediate rash, facial/tongue/throat swelling, SOB or lightheadedness with hypotension: Yes Has patient had a PCN reaction causing severe rash involving mucus membranes or skin necrosis: Yes Has patient had a PCN reaction that required hospitalization Unknown Has patient had a PCN reaction occurring within the last 10 years: No If all of the above answers are "NO", then may proceed with Cephalosporin use.     Social History   Socioeconomic History  . Marital status: Married    Spouse name: Not on file  . Number of children: 3  . Years of education: Not on file  . Highest education level: Not on file  Occupational History  . Not on file  Tobacco Use  . Smoking  status: Never Smoker  . Smokeless tobacco: Never Used  Substance and Sexual Activity  . Alcohol use: No  . Drug use: No  . Sexual activity: Yes    Birth control/protection: I.U.D.  Other Topics Concern  . Not on file  Social History Narrative  . Not on  file   Social Determinants of Health   Financial Resource Strain:   . Difficulty of Paying Living Expenses: Not on file  Food Insecurity:   . Worried About Charity fundraiser in the Last Year: Not on file  . Ran Out of Food in the Last Year: Not on file  Transportation Needs:   . Lack of Transportation (Medical): Not on file  . Lack of Transportation (Non-Medical): Not on file  Physical Activity:   . Days of Exercise per Week: Not on file  . Minutes of Exercise per Session: Not on file  Stress:   . Feeling of Stress : Not on file  Social Connections:   . Frequency of Communication with Friends and Family: Not on file  . Frequency of Social Gatherings with Friends and Family: Not on file  . Attends Religious Services: Not on file  . Active Member of Clubs or Organizations: Not on file  . Attends Archivist Meetings: Not on file  . Marital Status: Not on file    Family History  Problem Relation Age of Onset  . Hypertension Mother   . Heart disease Maternal Grandmother        pacemaker  . Gallbladder disease Maternal Grandmother   . Hypertension Father   . Heart disease Father        pacemaker  . Cancer Paternal Grandfather        colon cancer  . Stroke Paternal Grandfather   . Macular degeneration Paternal Grandmother   . Leukemia Son   . Asthma Son   . Colon cancer Neg Hx   . Liver disease Neg Hx

## 2019-08-31 NOTE — Progress Notes (Addendum)
PELVIC US TA/TV: homogeneous anteverted uterus,wnl,malpositioned IUD, IUD appears to be angled anterior in the myometrium mid uterus,EEC 3.5 mm,normal ovaries,no free fluid,some discomfort during ultrasound,no free fluid,chaperone Tammy

## 2019-09-01 ENCOUNTER — Other Ambulatory Visit: Payer: BC Managed Care – PPO

## 2019-09-01 ENCOUNTER — Ambulatory Visit: Payer: BC Managed Care – PPO | Admitting: Obstetrics & Gynecology

## 2019-11-23 ENCOUNTER — Other Ambulatory Visit: Payer: Self-pay | Admitting: *Deleted

## 2019-11-23 DIAGNOSIS — R945 Abnormal results of liver function studies: Secondary | ICD-10-CM

## 2019-11-23 DIAGNOSIS — R7989 Other specified abnormal findings of blood chemistry: Secondary | ICD-10-CM

## 2019-11-27 ENCOUNTER — Encounter: Payer: Self-pay | Admitting: *Deleted

## 2021-03-17 ENCOUNTER — Other Ambulatory Visit: Payer: Self-pay

## 2021-03-17 ENCOUNTER — Telehealth: Payer: Self-pay

## 2021-03-17 DIAGNOSIS — R7989 Other specified abnormal findings of blood chemistry: Secondary | ICD-10-CM

## 2021-03-17 NOTE — Telephone Encounter (Signed)
Phoned and spoke with the pt advised of the labs Heather [name] wants to start with then we will give further recommendations after the results. Put in mail to the pt to have done at [facility].

## 2021-03-17 NOTE — Telephone Encounter (Signed)
We can go ahead and mail her CMP to be done and go from there.

## 2021-03-17 NOTE — Progress Notes (Signed)
ERROR

## 2021-03-17 NOTE — Telephone Encounter (Signed)
This pt hasn't been seen since 10/15/2017 and she is calling inquiring of blood work you wanted done years ago. Will this pt need a office visit you first? Or how do you want to handle it

## 2021-05-01 ENCOUNTER — Telehealth: Payer: Self-pay | Admitting: Gastroenterology

## 2021-05-01 NOTE — Telephone Encounter (Signed)
Darl Pikes,  Patient had an HFP done for me at [facility]. Can we please request results? Thanks!

## 2021-05-05 NOTE — Telephone Encounter (Signed)
I faxed a requested to [facility] and also mailed a release of records to patient to sign and mail back to office.

## 2021-11-21 ENCOUNTER — Other Ambulatory Visit: Payer: Self-pay

## 2021-11-21 ENCOUNTER — Other Ambulatory Visit (HOSPITAL_COMMUNITY)
Admission: RE | Admit: 2021-11-21 | Discharge: 2021-11-21 | Disposition: A | Discharge status: Home / Self Care | Payer: BC Managed Care – PPO | Source: Ambulatory Visit | Attending: Obstetrics & Gynecology | Admitting: Obstetrics & Gynecology

## 2021-11-21 ENCOUNTER — Encounter: Payer: Self-pay | Admitting: Obstetrics & Gynecology

## 2021-11-21 ENCOUNTER — Ambulatory Visit (INDEPENDENT_AMBULATORY_CARE_PROVIDER_SITE_OTHER): Payer: BC Managed Care – PPO | Admitting: Obstetrics & Gynecology

## 2021-11-21 VITALS — BP 108/58 | HR 59 | Ht 64.0 in | Wt 158.0 lb

## 2021-11-21 DIAGNOSIS — Z01419 Encounter for gynecological examination (general) (routine) without abnormal findings: Secondary | ICD-10-CM | POA: Diagnosis not present

## 2021-11-21 NOTE — Progress Notes (Signed)
Subjective:     Heather [name] is a 41 y.o. female here for a routine exam.  Patient's last menstrual period was 11/12/2021 (exact date). [id number] Birth Control Method:  vasectomy Menstrual Calendar(currently): regular  Current complaints: none.   Current acute medical issues:     Recent Gynecologic History Patient's last menstrual period was 11/12/2021 (exact date). Last Pap: 2020  normal Last mammogram: 2022,  normal  Past Medical History:  Diagnosis Date   Abnormal Pap smear    Adenotonsillar hypertrophy    denies snoring during sleep or apnea   Anxiety    Complication of anesthesia    PT doesnt want any heavy narcotics   GERD (gastroesophageal reflux disease)    HPV (human papilloma virus) infection    IUD (intrauterine device) in place    Palpitations    Panic attack    RUQ pain 01/02/2016   Strep throat    to start antibiotic 08/20/2014    Past Surgical History:  Procedure Laterality Date   CERVICAL CONIZATION W/BX N/A 08/27/2014   Procedure: CONIZATION CERVIX ;  Surgeon: Lazaro Arms, MD;  Location: [facility];  Service: Gynecology;  Laterality: N/A;  Laser conization and ablation of cervix   CHOLECYSTECTOMY N/A 07/22/2016   Procedure: LAPAROSCOPIC CHOLECYSTECTOMY;  Surgeon: Franky Macho, MD;  Location: [facility];  Service: General;  Laterality: N/A;   CO2 LASER APPLICATION N/A 08/27/2014   Procedure: CO2 LASER APPLICATION;  Surgeon: Lazaro Arms, MD;  Location: [facility];  Service: Gynecology;  Laterality: N/A;   LIVER BIOPSY N/A 07/22/2016   Procedure: LIVER BIOPSY;  Surgeon: Franky Macho, MD;  Location: [facility];  Service: General;  Laterality: N/A;   TONSILLECTOMY AND ADENOIDECTOMY Bilateral 08/27/2014   Procedure: BILATERAL TONSILLECTOMY AND ADENOIDECTOMY;  Surgeon: Darletta Moll, MD;  Location: [facility];  Service: ENT;  Laterality: Bilateral;   TYMPANOSTOMY TUBE PLACEMENT Bilateral     OB History     Gravida  3    Para  3   Term  2   Preterm      AB      Living  3      SAB      IAB      Ectopic      Multiple      Live Births  3           Social History   Socioeconomic History   Marital status: Married    Spouse name: Not on file   Number of children: 3   Years of education: Not on file   Highest education level: Not on file  Occupational History   Not on file  Tobacco Use   Smoking status: Never   Smokeless tobacco: Never  Vaping Use   Vaping Use: Never used  Substance and Sexual Activity   Alcohol use: No   Drug use: No   Sexual activity: Yes    Birth control/protection: Surgical    Comment: vasectomy  Other Topics Concern   Not on file  Social History Narrative   Not on file   Social Determinants of Health   Financial Resource Strain: Low Risk    Difficulty of Paying Living Expenses: Not hard at all  Food Insecurity: No Food Insecurity   Worried About Programme researcher, broadcasting/film/video in the Last Year: Never true   Ran Out of Food in the Last Year: Never true  Transportation Needs: No Transportation Needs  Lack of Transportation (Medical): No   Lack of Transportation (Non-Medical): No  Physical Activity: Sufficiently Active   Days of Exercise per Week: 5 days   Minutes of Exercise per Session: 60 min  Stress: Stress Concern Present   Feeling of Stress : To some extent  Social Connections: Moderately Integrated   Frequency of Communication with Friends and Family: More than three times a week   Frequency of Social Gatherings with Friends and Family: Once a week   Attends Religious Services: More than 4 times per year   Active Member of [employer] or Organizations: No   Attends Music therapist: Never   Marital Status: Married    Family History  Problem Relation Age of Onset   Hypertension Mother    Heart disease Maternal Grandmother        pacemaker   Gallbladder disease Maternal Grandmother    Hypertension Father    Heart disease Father         pacemaker   Cancer Paternal Grandfather        colon cancer   Stroke Paternal Grandfather    Macular degeneration Paternal Grandmother    Leukemia Son    Asthma Son    Colon cancer Neg Hx    Liver disease Neg Hx      Current Outpatient Medications:    calcium carbonate (TUMS EX) 750 MG chewable tablet, Chew 1 tablet by mouth daily as needed. , Disp: , Rfl:    cetirizine (ZYRTEC) 10 MG tablet, Take 10 mg by mouth as needed. , Disp: , Rfl:    cholecalciferol (VITAMIN D) 1000 units tablet, Take 1,000 Units by mouth daily., Disp: , Rfl:    citalopram (CELEXA) 20 MG tablet, TAKE 1/2 (ONE-HALF) TABLET BY MOUTH ONCE DAILY, Disp: 15 tablet, Rfl: 11   famotidine (PEPCID) 20 MG tablet, Take by mouth., Disp: , Rfl:    Omega-3 Fatty Acids (FISH OIL) 1000 MG CAPS, Fish Oil, Disp: , Rfl:    Probiotic Product (PROBIOTIC PO), Take by mouth daily., Disp: , Rfl:    thyroid (ARMOUR) 30 MG tablet, Take 45 mg by mouth daily before breakfast. , Disp: , Rfl:   Review of Systems  Review of Systems  Constitutional: Negative for fever, chills, weight loss, malaise/fatigue and diaphoresis.  HENT: Negative for hearing loss, ear pain, nosebleeds, congestion, sore throat, neck pain, tinnitus and ear discharge.   Eyes: Negative for blurred vision, double vision, photophobia, pain, discharge and redness.  Respiratory: Negative for cough, hemoptysis, sputum production, shortness of breath, wheezing and stridor.   Cardiovascular: Negative for chest pain, palpitations, orthopnea, claudication, leg swelling and PND.  Gastrointestinal: negative for abdominal pain. Negative for heartburn, nausea, vomiting, diarrhea, constipation, blood in stool and melena.  Genitourinary: Negative for dysuria, urgency, frequency, hematuria and flank pain.  Musculoskeletal: Negative for myalgias, back pain, joint pain and falls.  Skin: Negative for itching and rash.  Neurological: Negative for dizziness, tingling, tremors, sensory  change, speech change, focal weakness, seizures, loss of consciousness, weakness and headaches.  Endo/Heme/Allergies: Negative for environmental allergies and polydipsia. Does not bruise/bleed easily.  Psychiatric/Behavioral: Negative for depression, suicidal ideas, hallucinations, memory loss and substance abuse. The patient is not nervous/anxious and does not have insomnia.        Objective:  Blood pressure (!) 108/58, pulse (!) 59, height 5\' 4"  (1.626 m), weight 158 lb (71.7 kg), last menstrual period 11/12/2021.   Physical Exam  Vitals reviewed. Constitutional: She is oriented to person, place,  and time. She appears well-developed and well-nourished.  HENT:  Head: Normocephalic and atraumatic.        Right Ear: External ear normal.  Left Ear: External ear normal.  Nose: Nose normal.  Mouth/Throat: Oropharynx is clear and moist.  Eyes: Conjunctivae and EOM are normal. Pupils are equal, round, and reactive to light. Right eye exhibits no discharge. Left eye exhibits no discharge. No scleral icterus.  Neck: Normal range of motion. Neck supple. No tracheal deviation present. No thyromegaly present.  Cardiovascular: Normal rate, regular rhythm, normal heart sounds and intact distal pulses.  Exam reveals no gallop and no friction rub.   No murmur heard. Respiratory: Effort normal and breath sounds normal. No respiratory distress. She has no wheezes. She has no rales. She exhibits no tenderness.  GI: Soft. Bowel sounds are normal. She exhibits no distension and no mass. There is no tenderness. There is no rebound and no guarding.  Genitourinary:  Breasts no masses skin changes or nipple changes bilaterally      Vulva is normal without lesions Vagina is pink moist without discharge Cervix normal in appearance and pap is done Uterus is normal size shape and contour Adnexa is negative with normal sized ovaries   Musculoskeletal: Normal range of motion. She exhibits no edema and no  tenderness.  Neurological: She is alert and oriented to person, place, and time. She has normal reflexes. She displays normal reflexes. No cranial nerve deficit. She exhibits normal muscle tone. Coordination normal.  Skin: Skin is warm and dry. No rash noted. No erythema. No pallor.  Psychiatric: She has a normal mood and affect. Her behavior is normal. Judgment and thought content normal.       Medications Ordered at today's visit: No orders of the defined types were placed in this encounter.   Other orders placed at today's visit: No orders of the defined types were placed in this encounter.     Assessment:    Normal Gyn exam.   Hx of HSIL Plan:    Contraception: vasectomy. Follow up in: 3 years.     Return in about 3 years (around 11/21/2024) for yearly.

## 2021-11-25 LAB — CYTOLOGY - PAP
Chlamydia: NEGATIVE
Comment: NEGATIVE
Comment: NEGATIVE
Comment: NORMAL
Diagnosis: NEGATIVE
High risk HPV: NEGATIVE
Neisseria Gonorrhea: NEGATIVE

## 2021-11-27 ENCOUNTER — Encounter: Payer: Self-pay | Admitting: Obstetrics & Gynecology

## 2022-01-22 ENCOUNTER — Ambulatory Visit (INDEPENDENT_AMBULATORY_CARE_PROVIDER_SITE_OTHER): Payer: BC Managed Care – PPO | Admitting: Obstetrics & Gynecology

## 2022-01-22 VITALS — BP 113/70 | HR 62 | Ht 64.0 in | Wt 161.0 lb

## 2022-01-22 DIAGNOSIS — N941 Unspecified dyspareunia: Secondary | ICD-10-CM

## 2022-01-22 DIAGNOSIS — R102 Pelvic and perineal pain: Secondary | ICD-10-CM | POA: Diagnosis not present

## 2022-01-22 NOTE — Progress Notes (Signed)
Chief Complaint  Patient presents with   Follow-up    Pelvic congestion on ultrasound      41 y.o. G3P2003 Patient's last menstrual period was 01/16/2022. The current method of family planning is COC  Outpatient Encounter Medications as of 01/22/2022  Medication Sig Note   calcium carbonate (TUMS EX) 750 MG chewable tablet Chew 1 tablet by mouth daily as needed.     cetirizine (ZYRTEC) 10 MG tablet Take 10 mg by mouth as needed.     cholecalciferol (VITAMIN D) 1000 units tablet Take 1,000 Units by mouth daily.    citalopram (CELEXA) 20 MG tablet TAKE 1/2 (ONE-HALF) TABLET BY MOUTH ONCE DAILY    Drospirenone-Estetrol 3-14.2 MG TABS Take 1 tablet by mouth daily.    famotidine (PEPCID) 20 MG tablet Take by mouth.    Probiotic Product (PROBIOTIC PO) Take by mouth daily.    thyroid (ARMOUR) 30 MG tablet Take 45 mg by mouth daily before breakfast.  09/09/2016: Received from: [facility]   [DISCONTINUED] Omega-3 Fatty Acids (Lee) 1000 MG CAPS Fish Oil    No facility-administered encounter medications on file as of 01/22/2022.    Subjective Pt seen for follow up of a CT which showed pelvic congestion syndrome She does have consistent pain with intercourse, deep thrusting nearly 100% I told her pelvic congestion doesn't really cause pain unless there are varices of the ovarian vein system Past Medical History:  Diagnosis Date   Abnormal Pap smear    Adenotonsillar hypertrophy    denies snoring during sleep or apnea   Anxiety    Complication of anesthesia    PT doesnt want any heavy narcotics   GERD (gastroesophageal reflux disease)    HPV (human papilloma virus) infection    IUD (intrauterine device) in place    Palpitations    Panic attack    RUQ pain 01/02/2016   Strep throat    to start antibiotic 08/20/2014    Past Surgical History:  Procedure Laterality Date   CERVICAL CONIZATION W/BX N/A 08/27/2014   Procedure: CONIZATION CERVIX ;   Surgeon: Florian Buff, MD;  Location: [facility];  Service: Gynecology;  Laterality: N/A;  Laser conization and ablation of cervix   CHOLECYSTECTOMY N/A 07/22/2016   Procedure: LAPAROSCOPIC CHOLECYSTECTOMY;  Surgeon: Aviva Signs, MD;  Location: [facility];  Service: General;  Laterality: N/A;   CO2 LASER APPLICATION N/A [id number]   Procedure: CO2 LASER APPLICATION;  Surgeon: Florian Buff, MD;  Location: [facility];  Service: Gynecology;  Laterality: N/A;   LIVER BIOPSY N/A 07/22/2016   Procedure: LIVER BIOPSY;  Surgeon: Aviva Signs, MD;  Location: [facility];  Service: General;  Laterality: N/A;   TONSILLECTOMY AND ADENOIDECTOMY Bilateral 08/27/2014   Procedure: BILATERAL TONSILLECTOMY AND ADENOIDECTOMY;  Surgeon: Ascencion Dike, MD;  Location: [facility];  Service: ENT;  Laterality: Bilateral;   TYMPANOSTOMY TUBE PLACEMENT Bilateral     OB History     Gravida  3   Para  3   Term  2   Preterm      AB      Living  3      SAB      IAB      Ectopic      Multiple      Live Births  3           Allergies  Allergen Reactions   Epinephrine  Other (See Comments)    DUE TO PALPITATIONS   Lidocaine Other (See Comments)    TACHYCARDIA   Contrast Media [Iodinated Contrast Media]    Doxycycline Other (See Comments)    Headaches and anxiety   Penicillins Hives    AS A CHILD - STATES CAN TAKE AMOXICILLIN Has patient had a PCN reaction causing immediate rash, facial/tongue/throat swelling, SOB or lightheadedness with hypotension: Yes Has patient had a PCN reaction causing severe rash involving mucus membranes or skin necrosis: Yes Has patient had a PCN reaction that required hospitalization Unknown Has patient had a PCN reaction occurring within the last 10 years: No If all of the above answers are "NO", then may proceed with Cephalosporin use.     Social History   Socioeconomic History   Marital status: Married    Spouse name: Not on  file   Number of children: 3   Years of education: Not on file   Highest education level: Not on file  Occupational History   Not on file  Tobacco Use   Smoking status: Never   Smokeless tobacco: Never  Vaping Use   Vaping Use: Never used  Substance and Sexual Activity   Alcohol use: No   Drug use: No   Sexual activity: Yes    Birth control/protection: Surgical    Comment: vasectomy  Other Topics Concern   Not on file  Social History Narrative   Not on file   Social Determinants of Health   Financial Resource Strain: Low Risk  (11/21/2021)   Overall Financial Resource Strain (CARDIA)    Difficulty of Paying Living Expenses: Not hard at all  Food Insecurity: No Food Insecurity (11/21/2021)   Hunger Vital Sign    Worried About Running Out of Food in the Last Year: Never true    [place] in the Last Year: Never true  Transportation Needs: No Transportation Needs (11/21/2021)   PRAPARE - Hydrologist (Medical): No    Lack of Transportation (Non-Medical): No  Physical Activity: Sufficiently Active (11/21/2021)   Exercise Vital Sign    Days of Exercise per Week: 5 days    Minutes of Exercise per Session: 60 min  Stress: Stress Concern Present (11/21/2021)   [place]    Feeling of Stress : To some extent  Social Connections: Moderately Integrated (11/21/2021)   Social Connection and Isolation Panel [NHANES]    Frequency of Communication with Friends and Family: More than three times a week    Frequency of Social Gatherings with Friends and Family: Once a week    Attends Religious Services: More than 4 times per year    Active Member of [employer] or Organizations: No    Attends Music therapist: Never    Marital Status: Married    Family History  Problem Relation Age of Onset   Hypertension Mother    Heart disease Maternal Grandmother        pacemaker   Gallbladder  disease Maternal Grandmother    Hypertension Father    Heart disease Father        pacemaker   Cancer Paternal Grandfather        colon cancer   Stroke Paternal Grandfather    Macular degeneration Paternal Grandmother    Leukemia Son    Asthma Son    Colon cancer Neg Hx    Liver disease Neg Hx  Medications:       Current Outpatient Medications:    calcium carbonate (TUMS EX) 750 MG chewable tablet, Chew 1 tablet by mouth daily as needed. , Disp: , Rfl:    cetirizine (ZYRTEC) 10 MG tablet, Take 10 mg by mouth as needed. , Disp: , Rfl:    cholecalciferol (VITAMIN D) 1000 units tablet, Take 1,000 Units by mouth daily., Disp: , Rfl:    citalopram (CELEXA) 20 MG tablet, TAKE 1/2 (ONE-HALF) TABLET BY MOUTH ONCE DAILY, Disp: 15 tablet, Rfl: 11   Drospirenone-Estetrol 3-14.2 MG TABS, Take 1 tablet by mouth daily., Disp: 28 tablet, Rfl: 12   famotidine (PEPCID) 20 MG tablet, Take by mouth., Disp: , Rfl:    Probiotic Product (PROBIOTIC PO), Take by mouth daily., Disp: , Rfl:    thyroid (ARMOUR) 30 MG tablet, Take 45 mg by mouth daily before breakfast. , Disp: , Rfl:   Objective Blood pressure 113/70, pulse 62, height 5\' 4"  (1.626 m), weight 161 lb (73 kg), last menstrual period 01/16/2022.  [facility] NAD  Pertinent ROS No burning with urination, frequency or urgency No nausea, vomiting or diarrhea Nor fever chills or other constitutional symptoms   Labs or studies Reviewed scan     Impression + Management Plan: Diagnoses this Encounter::   ICD-10-CM   1. Dyspareunia, female, bump, 6 months duration  N94.10    reproducible with exam, 100%, improves with postiion change sometimes    2. Pelvic pain, episodic, worsening over the past 4 month  R10.2    trial of COC: nextstellis for 3 months, then follow up and see her repsonse        Medications prescribed during  this encounter: Meds ordered this encounter  Medications   Drospirenone-Estetrol 3-14.2 MG TABS    Sig: Take 1  tablet by mouth daily.    Dispense:  28 tablet    Refill:  12    Labs or Scans Ordered during this encounter: No orders of the defined types were placed in this encounter.     Follow up Return for keep scheduled.

## 2022-04-24 ENCOUNTER — Encounter: Payer: Self-pay | Admitting: Obstetrics & Gynecology

## 2022-04-24 ENCOUNTER — Ambulatory Visit (INDEPENDENT_AMBULATORY_CARE_PROVIDER_SITE_OTHER): Payer: BC Managed Care – PPO | Admitting: Obstetrics & Gynecology

## 2022-04-24 VITALS — BP 106/70 | HR 54 | Ht 65.0 in | Wt 167.0 lb

## 2022-04-24 DIAGNOSIS — N946 Dysmenorrhea, unspecified: Secondary | ICD-10-CM

## 2022-04-24 DIAGNOSIS — N941 Unspecified dyspareunia: Secondary | ICD-10-CM | POA: Diagnosis not present

## 2022-04-24 NOTE — Progress Notes (Signed)
Chief Complaint  Patient presents with   Follow-up    Pt sometimes feels better!!      41 y.o. G3P2003 Patient's last menstrual period was 04/18/2022. The current method of family planning is vasectomy.  Outpatient Encounter Medications as of 04/24/2022  Medication Sig Note   calcium carbonate (TUMS EX) 750 MG chewable tablet Chew 1 tablet by mouth daily as needed.     cetirizine (ZYRTEC) 10 MG tablet Take 10 mg by mouth as needed.     cholecalciferol (VITAMIN D) 1000 units tablet Take 1,000 Units by mouth daily.    citalopram (CELEXA) 20 MG tablet TAKE 1/2 (ONE-HALF) TABLET BY MOUTH ONCE DAILY    famotidine (PEPCID) 20 MG tablet Take by mouth.    Probiotic Product (PROBIOTIC PO) Take by mouth daily.    thyroid (ARMOUR) 30 MG tablet Take 45 mg by mouth daily before breakfast.  09/09/2016: Received from: [facility]   [DISCONTINUED] Omega-3 Fatty Acids (FISH OIL) 1000 MG CAPS Fish Oil    No facility-administered encounter medications on file as of 04/24/2022.    Subjective Pt continues to have pain with intercourse 100%, bump, deep, worsneing Dysmenorrhea is worsening as well Periods getting lighter but crampier  Past Medical History:  Diagnosis Date   Abnormal Pap smear    Adenotonsillar hypertrophy    denies snoring during sleep or apnea   Anxiety    Complication of anesthesia    PT doesnt want any heavy narcotics   GERD (gastroesophageal reflux disease)    HPV (human papilloma virus) infection    IUD (intrauterine device) in place    Palpitations    Panic attack    RUQ pain 01/02/2016   Strep throat    to start antibiotic 08/20/2014    Past Surgical History:  Procedure Laterality Date   CERVICAL CONIZATION W/BX N/A 08/27/2014   Procedure: CONIZATION CERVIX ;  Surgeon: Lazaro Arms, MD;  Location: [facility];  Service: Gynecology;  Laterality: N/A;  Laser conization and ablation of cervix   CHOLECYSTECTOMY N/A 07/22/2016    Procedure: LAPAROSCOPIC CHOLECYSTECTOMY;  Surgeon: Franky Macho, MD;  Location: [facility];  Service: General;  Laterality: N/A;   CO2 LASER APPLICATION N/A 08/27/2014   Procedure: CO2 LASER APPLICATION;  Surgeon: Lazaro Arms, MD;  Location: [facility];  Service: Gynecology;  Laterality: N/A;   LIVER BIOPSY N/A 07/22/2016   Procedure: LIVER BIOPSY;  Surgeon: Franky Macho, MD;  Location: [facility];  Service: General;  Laterality: N/A;   TONSILLECTOMY AND ADENOIDECTOMY Bilateral 08/27/2014   Procedure: BILATERAL TONSILLECTOMY AND ADENOIDECTOMY;  Surgeon: Darletta Moll, MD;  Location: [facility];  Service: ENT;  Laterality: Bilateral;   TYMPANOSTOMY TUBE PLACEMENT Bilateral     OB History     Gravida  3   Para  3   Term  2   Preterm      AB      Living  3      SAB      IAB      Ectopic      Multiple      Live Births  3           Allergies  Allergen Reactions   Epinephrine Other (See Comments)    DUE TO PALPITATIONS   Lidocaine Other (See Comments)    TACHYCARDIA   Contrast Media [Iodinated Contrast Media]    Doxycycline Other (See Comments)  Headaches and anxiety   Penicillins Hives    AS A CHILD - STATES CAN TAKE AMOXICILLIN Has patient had a PCN reaction causing immediate rash, facial/tongue/throat swelling, SOB or lightheadedness with hypotension: Yes Has patient had a PCN reaction causing severe rash involving mucus membranes or skin necrosis: Yes Has patient had a PCN reaction that required hospitalization Unknown Has patient had a PCN reaction occurring within the last 10 years: No If all of the above answers are "NO", then may proceed with Cephalosporin use.     Social History   Socioeconomic History   Marital status: Married    Spouse name: Not on file   Number of children: 3   Years of education: Not on file   Highest education level: Not on file  Occupational History   Not on file  Tobacco Use   Smoking status: Never    Smokeless tobacco: Never  Vaping Use   Vaping Use: Never used  Substance and Sexual Activity   Alcohol use: No   Drug use: No   Sexual activity: Yes    Birth control/protection: Surgical    Comment: vasectomy  Other Topics Concern   Not on file  Social History Narrative   Not on file   Social Determinants of Health   Financial Resource Strain: Low Risk  (11/21/2021)   Overall Financial Resource Strain (CARDIA)    Difficulty of Paying Living Expenses: Not hard at all  Food Insecurity: No Food Insecurity (11/21/2021)   Hunger Vital Sign    Worried About Running Out of Food in the Last Year: Never true    Ran Out of Food in the Last Year: Never true  Transportation Needs: No Transportation Needs (11/21/2021)   PRAPARE - Administrator, Civil Service (Medical): No    Lack of Transportation (Non-Medical): No  Physical Activity: Sufficiently Active (11/21/2021)   Exercise Vital Sign    Days of Exercise per Week: 5 days    Minutes of Exercise per Session: 60 min  Stress: Stress Concern Present (11/21/2021)   [employer] of Occupational Health - Occupational Stress Questionnaire    Feeling of Stress : To some extent  Social Connections: Moderately Integrated (11/21/2021)   Social Connection and Isolation Panel [NHANES]    Frequency of Communication with Friends and Family: More than three times a week    Frequency of Social Gatherings with Friends and Family: Once a week    Attends Religious Services: More than 4 times per year    Active Member of [employer] or Organizations: No    Attends Engineer, structural: Never    Marital Status: Married    Family History  Problem Relation Age of Onset   Hypertension Mother    Heart disease Maternal Grandmother        pacemaker   Gallbladder disease Maternal Grandmother    Hypertension Father    Heart disease Father        pacemaker   Cancer Paternal Grandfather        colon cancer   Stroke Paternal Grandfather     Macular degeneration Paternal Grandmother    Leukemia Son    Asthma Son    Colon cancer Neg Hx    Liver disease Neg Hx     Medications:       Current Outpatient Medications:    calcium carbonate (TUMS EX) 750 MG chewable tablet, Chew 1 tablet by mouth daily as needed. , Disp: , Rfl:  cetirizine (ZYRTEC) 10 MG tablet, Take 10 mg by mouth as needed. , Disp: , Rfl:    cholecalciferol (VITAMIN D) 1000 units tablet, Take 1,000 Units by mouth daily., Disp: , Rfl:    citalopram (CELEXA) 20 MG tablet, TAKE 1/2 (ONE-HALF) TABLET BY MOUTH ONCE DAILY, Disp: 15 tablet, Rfl: 11   famotidine (PEPCID) 20 MG tablet, Take by mouth., Disp: , Rfl:    Probiotic Product (PROBIOTIC PO), Take by mouth daily., Disp: , Rfl:    thyroid (ARMOUR) 30 MG tablet, Take 45 mg by mouth daily before breakfast. , Disp: , Rfl:   Objective Blood pressure 106/70, pulse (!) 54, height 5\' 5"  (1.651 m), weight 167 lb (75.8 kg), last menstrual period 04/18/2022.  General [facility] female NAD Vulva:  normal appearing vulva with no masses, tenderness or lesions Vagina:  normal mucosa, no discharge Cervix:  Normal no lesions Uterus:  normal size, contour, position, consistency, mobility, non-tender Adnexa: ovaries:present,  normal adnexa in size, nontender and no masses  Appropriate for [facility]  Pertinent ROS No burning with urination, frequency or urgency No nausea, vomiting or diarrhea Nor fever chills or other constitutional symptoms   Labs or studies reviewed    Impression + Management Plan: Diagnoses this Encounter::   ICD-10-CM   1. Dyspareunia, female, bump, 6 months duration  N94.10    persists now for about 1 year    2. Dysmenorrhea  N94.6    worsening over time     Recommend TV[facility], remove tubes(if possible) 07/08/22   Medications prescribed during  this encounter: No orders of the defined types were placed in this encounter.   Labs or Scans Ordered during this encounter: No orders of the defined types  were placed in this encounter.     Follow up Return in about 12 weeks (around 07/17/2022) for Post Op, with Dr 07/19/2022.

## 2022-04-27 ENCOUNTER — Encounter: Payer: Self-pay | Admitting: Obstetrics & Gynecology

## 2022-06-17 MED ORDER — DROSPIRENONE-ESTETROL 3-14.2 MG PO TABS: 1.0000 | ORAL_TABLET | Freq: Every day | ORAL | 12 refills | Status: DC

## 2022-07-06 ENCOUNTER — Other Ambulatory Visit (HOSPITAL_COMMUNITY): Payer: BC Managed Care – PPO

## 2022-07-08 NOTE — Patient Instructions (Signed)
Heather [name]  07/08/2022     @PREFPERIOPPHARMACY @   Your procedure is scheduled on  07/15/2022.   Report to [facility] at  0900  A.M.   Call this number if you have problems the morning of surgery:  [phone]  If you experience any cold or flu symptoms such as cough, fever, chills, shortness of breath, etc. between now and your scheduled surgery, please notify us at the above number.   Remember:  Do not eat after midnight.    You may drink clear liquids until  0700 am on 07/15/2022.    Clear liquids allowed are:                    Water, Juice (No red color; non-citric and without pulp; diabetics please choose diet or no sugar options), Carbonated beverages (diabetics please choose diet or no sugar options), Clear Tea (No creamer, milk, or cream, including half & half and powdered creamer), Black Coffee Only (No creamer, milk or cream, including half & half and powdered creamer), Plain Jell-O Only (No red color; diabetics please choose no sugar options), Clear Sports drink (No red color; diabetics please choose diet or no sugar options), and Plain Popsicles Only (No red color; diabetics please choose no sugar options)    At 0700 on 07/15/2022 drink your carb drink. You can have nothing else to drink after this.    Take these medicines the morning of surgery with A SIP OF WATER                              zyrtec, citalopram, pepcid, thyroid.     Do not wear jewelry, make-up or nail polish.  Do not wear lotions, powders, or perfumes, or deodorant.  Do not shave 48 hours prior to surgery.  Men may shave face and neck.  Do not bring valuables to the hospital.  [facility] is not responsible for any belongings or valuables.  Contacts, dentures or bridgework may not be worn into surgery.  Leave your suitcase in the car.  After surgery it may be brought to your room.  For patients admitted to the hospital, discharge time will be determined by your treatment  team.  Patients discharged the day of surgery will not be allowed to drive home and must have someone with them for 24 hours.     Special instructions:   DO NOT smoke tobacco or vape for 24 hours before your procedure.  Please read over the following fact sheets that you were given. Coughing and Deep Breathing, Surgical Site Infection Prevention, Anesthesia Post-op Instructions, and Care and Recovery After Surgery      Vaginal Hysterectomy, Care After The following information offers guidance on how to care for yourself after your procedure. Your health care provider may also give you more specific instructions. If you have problems or questions, contact your health care provider. What can I expect after the procedure? After the procedure, it is common to have: Pain in the lower abdomen and vagina. Vaginal bleeding and discharge for up to 1 week. You will need to use a sanitary pad after this procedure. Difficulty having a bowel movement (constipation). Temporary problems emptying the bladder. Tiredness (fatigue). Poor appetite. Less interest in sex. Feelings of sadness or other emotions. If your ovaries were also removed, it is also common to have symptoms of menopause, such as hot flashes, night  sweats, and lack of sleep (insomnia). Follow these instructions at home: Medicines  Take over-the-counter and prescription medicines only as told by your health care provider. Ask your health care provider if the medicine prescribed to you: Requires you to avoid driving or using machinery. Can cause constipation. You may need to take these actions to prevent or treat constipation: Drink enough fluid to keep your urine pale yellow. Take over-the-counter or prescription medicines. Eat foods that are high in fiber, such as beans, whole grains, and fresh fruits and vegetables. Limit foods that are high in fat and processed sugars, such as fried or sweet foods. Activity  Rest as told by  your health care provider. Return to your normal activities as told by your health care provider. Ask your health care provider what activities are safe for you Avoid sitting for a long time without moving. Get up to take short walks every 1-2 hours. This is important to improve blood flow and breathing. Ask for help if you feel weak or unsteady. Try to have someone home with you for 1-2 weeks to help you with everyday chores. Do not lift anything that is heavier than 10 lb (4.5 kg), or the limit that you are told, until your health care provider says that it is safe. If you were given a sedative during the procedure, it can affect you for several hours. Do not drive or operate machinery until your health care provider says that it is safe. Lifestyle Do not use any products that contain nicotine or tobacco. These products include cigarettes, chewing tobacco, and vaping devices, such as e-cigarettes. These can delay healing after surgery. If you need help quitting, ask your health care provider. Do not drink alcohol until your health care provider approves. General instructions Do not douche, use tampons, or have sex for at least 6 weeks, or as told by your health care provider. If you struggle with physical or emotional changes after your procedure, speak with your health care provider or a therapist. The stitches inside your vagina will dissolve over time and do not need to be taken out. Do not take baths, swim, or use a hot tub until your health care provider approves. You may only be allowed to take showers for 2-3 weeks Wear compression stockings as told by your health care provider. These stockings help to prevent blood clots and reduce swelling in your legs. Keep all follow-up visits. This is important. Contact a health care provider if: Your pain medicine is not helping. You have a fever. You have nausea or vomiting that does not go away. You feel dizzy. You have blood, pus, or a  bad-smelling discharge from your vagina more than 1 week after the procedure. You continue to have trouble urinating 3-5 days after the procedure. Get help right away if: You have severe pain in your abdomen or back. You faint. You have heavy vaginal bleeding and blood clots, soaking through a sanitary pad in less than 1 hour. You have chest pain or shortness of breath. You have pain, swelling, or redness in your leg. These symptoms may represent a serious problem that is an emergency. Do not wait to see if the symptoms will go away. Get medical help right away. Call your local emergency services (911 in the [place] Do not drive yourself to the hospital. Summary After the procedure, it is common to have pain, vaginal bleeding, constipation, temporary problems emptying your bladder, and feelings of sadness or other emotions. Take over-the-counter and  prescription medicines only as told by your health care provider. Rest as told by your health care provider. Return to your normal activities as told by your health care provider. Contact a health care provider if your pain medicine is not helping, or you have a fever, dizziness, or trouble urinating several days after the procedure. Get help right away if you have severe pain in your abdomen or back, or if you faint, have heavy bleeding, or have chest pain or shortness of breath. This information is not intended to replace advice given to you by your health care provider. Make sure you discuss any questions you have with your health care provider. Document Revised: 11/19/2021 Document Reviewed: 05/10/2020 Elsevier Patient Education  [place] Anesthesia, Adult, Care After The following information offers guidance on how to care for yourself after your procedure. Your health care provider may also give you more specific instructions. If you have problems or questions, contact your health care provider. What can I expect after the  procedure? After the procedure, it is common for people to: Have pain or discomfort at the IV site. Have nausea or vomiting. Have a sore throat or hoarseness. Have trouble concentrating. Feel cold or chills. Feel weak, sleepy, or tired (fatigue). Have soreness and body aches. These can affect parts of the body that were not involved in surgery. Follow these instructions at home: For the time period you were told by your health care provider:  Rest. Do not participate in activities where you could fall or become injured. Do not drive or use machinery. Do not drink alcohol. Do not take sleeping pills or medicines that cause drowsiness. Do not make important decisions or sign legal documents. Do not take care of children on your own. General instructions Drink enough fluid to keep your urine pale yellow. If you have sleep apnea, surgery and certain medicines can increase your risk for breathing problems. Follow instructions from your health care provider about wearing your sleep device: Anytime you are sleeping, including during daytime naps. While taking prescription pain medicines, sleeping medicines, or medicines that make you drowsy. Return to your normal activities as told by your health care provider. Ask your health care provider what activities are safe for you. Take over-the-counter and prescription medicines only as told by your health care provider. Do not use any products that contain nicotine or tobacco. These products include cigarettes, chewing tobacco, and vaping devices, such as e-cigarettes. These can delay incision healing after surgery. If you need help quitting, ask your health care provider. Contact a health care provider if: You have nausea or vomiting that does not get better with medicine. You vomit every time you eat or drink. You have pain that does not get better with medicine. You cannot urinate or have bloody urine. You develop a skin rash. You have a  fever. Get help right away if: You have trouble breathing. You have chest pain. You vomit blood. These symptoms may be an emergency. Get help right away. Call 911. Do not wait to see if the symptoms will go away. Do not drive yourself to the hospital. Summary After the procedure, it is common to have a sore throat, hoarseness, nausea, vomiting, or to feel weak, sleepy, or fatigue. For the time period you were told by your health care provider, do not drive or use machinery. Get help right away if you have difficulty breathing, have chest pain, or vomit blood. These symptoms may be an emergency. This information  is not intended to replace advice given to you by your health care provider. Make sure you discuss any questions you have with your health care provider. Document Revised: 12/05/2021 Document Reviewed: 12/05/2021 Elsevier Patient Education  [place].

## 2022-07-10 ENCOUNTER — Other Ambulatory Visit: Payer: Self-pay | Admitting: Obstetrics & Gynecology

## 2022-07-10 ENCOUNTER — Other Ambulatory Visit (HOSPITAL_COMMUNITY): Payer: BC Managed Care – PPO

## 2022-07-10 ENCOUNTER — Encounter (HOSPITAL_COMMUNITY): Payer: Self-pay

## 2022-07-10 ENCOUNTER — Encounter (HOSPITAL_COMMUNITY)
Admission: RE | Admit: 2022-07-10 | Discharge: 2022-07-10 | Disposition: A | Discharge status: Home / Self Care | Payer: BC Managed Care – PPO | Source: Ambulatory Visit | Attending: Obstetrics & Gynecology | Admitting: Obstetrics & Gynecology

## 2022-07-10 VITALS — BP 111/52 | HR 71 | Temp 97.7°F | Resp 18

## 2022-07-10 DIAGNOSIS — Z01818 Encounter for other preprocedural examination: Secondary | ICD-10-CM | POA: Diagnosis present

## 2022-07-10 LAB — COMPREHENSIVE METABOLIC PANEL
ALT: 78 U/L — ABNORMAL HIGH (ref 0–44)
AST: 33 U/L (ref 15–41)
Albumin: 4.4 g/dL (ref 3.5–5.0)
Alkaline Phosphatase: 79 U/L (ref 38–126)
Anion gap: 7 (ref 5–15)
BUN: 12 mg/dL (ref 6–20)
CO2: 27 mmol/L (ref 22–32)
Calcium: 9.7 mg/dL (ref 8.9–10.3)
Chloride: 106 mmol/L (ref 98–111)
Creatinine, Ser: 0.68 mg/dL (ref 0.44–1.00)
GFR, Estimated: >60 mL/min (ref >60)
Glucose, Bld: 86 mg/dL (ref 70–99)
Potassium: 3.8 mmol/L (ref 3.5–5.1)
Sodium: 140 mmol/L (ref 135–145)
Total Bilirubin: 1 mg/dL (ref 0.3–1.2)
Total Protein: 7.4 g/dL (ref 6.5–8.1)

## 2022-07-10 LAB — TYPE AND SCREEN
ABO/RH(D): A POS
Antibody Screen: NEGATIVE

## 2022-07-10 LAB — URINALYSIS, ROUTINE W REFLEX MICROSCOPIC
Bilirubin Urine: NEGATIVE
Glucose, UA: NEGATIVE mg/dL
Hgb urine dipstick: NEGATIVE
Ketones, ur: NEGATIVE mg/dL
Leukocytes,Ua: NEGATIVE
Nitrite: NEGATIVE
Protein, ur: NEGATIVE mg/dL
Specific Gravity, Urine: 1.003 — ABNORMAL LOW (ref 1.005–1.030)
pH: 6 (ref 5.0–8.0)

## 2022-07-10 LAB — CBC
HCT: 40.1 % (ref 36.0–46.0)
Hemoglobin: 13.7 g/dL (ref 12.0–15.0)
MCH: 30 pg (ref 26.0–34.0)
MCHC: 34.2 g/dL (ref 30.0–36.0)
MCV: 87.9 fL (ref 80.0–100.0)
Platelets: 172 10*3/uL (ref 150–400)
RBC: 4.56 MIL/uL (ref 3.87–5.11)
RDW: 11.8 % (ref 11.5–15.5)
WBC: 5.7 10*3/uL (ref 4.0–10.5)
nRBC: 0 % (ref 0.0–0.2)

## 2022-07-10 LAB — PREGNANCY, URINE: Preg Test, Ur: NEGATIVE

## 2022-07-10 LAB — RAPID HIV SCREEN (HIV 1/2 AB+AG)
HIV 1/2 Antibodies: NONREACTIVE
HIV-1 P24 Antigen - HIV24: NONREACTIVE

## 2022-07-13 NOTE — Pre-Procedure Instructions (Signed)
Patient called to say her son had strp last week and she started feeling "run down and sluggish" Her PCP did not test her for strep bu did treat her. She started PO antibiotics on Saturday and is "feeling much better now." She wanted to make sure it was oak to proceed with surgery. I spoke with Dr Briant Cedar, anesthesiologist, who is fine with proceeding. Patient notified.

## 2022-07-15 ENCOUNTER — Ambulatory Visit (HOSPITAL_COMMUNITY): Payer: BC Managed Care – PPO | Admitting: Certified Registered"

## 2022-07-15 ENCOUNTER — Other Ambulatory Visit: Payer: Self-pay

## 2022-07-15 ENCOUNTER — Encounter (HOSPITAL_COMMUNITY): Payer: Self-pay | Admitting: Obstetrics & Gynecology

## 2022-07-15 ENCOUNTER — Ambulatory Visit (HOSPITAL_COMMUNITY)
Admission: RE | Admit: 2022-07-15 | Discharge: 2022-07-15 | Disposition: A | Discharge status: Home / Self Care | Payer: BC Managed Care – PPO | Attending: Obstetrics & Gynecology | Admitting: Obstetrics & Gynecology

## 2022-07-15 ENCOUNTER — Encounter (HOSPITAL_COMMUNITY)
Admission: RE | Disposition: A | Discharge status: Home / Self Care | Payer: Self-pay | Source: Home / Self Care | Attending: Obstetrics & Gynecology

## 2022-07-15 DIAGNOSIS — N946 Dysmenorrhea, unspecified: Secondary | ICD-10-CM | POA: Diagnosis not present

## 2022-07-15 DIAGNOSIS — G709 Myoneural disorder, unspecified: Secondary | ICD-10-CM | POA: Insufficient documentation

## 2022-07-15 DIAGNOSIS — N8003 Adenomyosis of the uterus: Secondary | ICD-10-CM

## 2022-07-15 DIAGNOSIS — N888 Other specified noninflammatory disorders of cervix uteri: Secondary | ICD-10-CM | POA: Diagnosis not present

## 2022-07-15 DIAGNOSIS — N941 Unspecified dyspareunia: Secondary | ICD-10-CM | POA: Diagnosis present

## 2022-07-15 DIAGNOSIS — N814 Uterovaginal prolapse, unspecified: Secondary | ICD-10-CM | POA: Diagnosis not present

## 2022-07-15 HISTORY — PX: VAGINAL HYSTERECTOMY: SHX2639

## 2022-07-15 SURGERY — HYSTERECTOMY, VAGINAL
Anesthesia: General | Site: Vagina

## 2022-07-15 MED ORDER — STERILE WATER FOR IRRIGATION IR SOLN
Status: DC | PRN
  Administered 2022-07-15: 1000 mL

## 2022-07-15 MED ORDER — LACTATED RINGERS IV SOLN: INTRAVENOUS | Status: DC

## 2022-07-15 MED ORDER — SODIUM CHLORIDE 0.9 % IR SOLN
Status: DC | PRN
  Administered 2022-07-15: 3000 mL

## 2022-07-15 MED ORDER — MIDAZOLAM HCL 2 MG/2ML IJ SOLN
INTRAMUSCULAR | Status: AC
  Filled 2022-07-15: qty 2

## 2022-07-15 MED ORDER — DEXAMETHASONE SODIUM PHOSPHATE 10 MG/ML IJ SOLN
INTRAMUSCULAR | Status: DC | PRN
  Administered 2022-07-15: 10 mg via INTRAVENOUS

## 2022-07-15 MED ORDER — ACETAMINOPHEN 500 MG PO TABS
ORAL_TABLET | ORAL | Status: AC
  Filled 2022-07-15: qty 2

## 2022-07-15 MED ORDER — FENTANYL CITRATE (PF) 250 MCG/5ML IJ SOLN
INTRAMUSCULAR | Status: DC | PRN
  Administered 2022-07-15: 50 ug via INTRAVENOUS
  Administered 2022-07-15: 100 ug via INTRAVENOUS

## 2022-07-15 MED ORDER — METOCLOPRAMIDE HCL 5 MG/ML IJ SOLN
INTRAMUSCULAR | Status: AC
  Filled 2022-07-15: qty 2

## 2022-07-15 MED ORDER — METRONIDAZOLE 500 MG/100ML IV SOLN
500.0000 mg | INTRAVENOUS | Status: AC
  Administered 2022-07-15: 500 mg via INTRAVENOUS
  Filled 2022-07-15: qty 100

## 2022-07-15 MED ORDER — ACETAMINOPHEN 500 MG PO TABS
1000.0000 mg | ORAL_TABLET | Freq: Once | ORAL | Status: AC
  Administered 2022-07-15: 1000 mg via ORAL

## 2022-07-15 MED ORDER — KETAMINE HCL 10 MG/ML IJ SOLN
INTRAMUSCULAR | Status: DC | PRN
  Administered 2022-07-15: 10 mg via INTRAVENOUS
  Administered 2022-07-15: 20 mg via INTRAVENOUS

## 2022-07-15 MED ORDER — ORAL CARE MOUTH RINSE: 15.0000 mL | Freq: Once | OROMUCOSAL | Status: DC

## 2022-07-15 MED ORDER — FENTANYL CITRATE (PF) 250 MCG/5ML IJ SOLN
INTRAMUSCULAR | Status: AC
  Filled 2022-07-15: qty 5

## 2022-07-15 MED ORDER — BUPIVACAINE-EPINEPHRINE (PF) 0.5% -1:200000 IJ SOLN
INTRAMUSCULAR | Status: AC
  Filled 2022-07-15: qty 30

## 2022-07-15 MED ORDER — KETAMINE HCL 50 MG/5ML IJ SOSY
PREFILLED_SYRINGE | INTRAMUSCULAR | Status: AC
  Filled 2022-07-15: qty 5

## 2022-07-15 MED ORDER — CIPROFLOXACIN IN D5W 400 MG/200ML IV SOLN
400.0000 mg | INTRAVENOUS | Status: AC
  Administered 2022-07-15: 400 mg via INTRAVENOUS
  Filled 2022-07-15: qty 200

## 2022-07-15 MED ORDER — OXYCODONE-ACETAMINOPHEN 5-325 MG PO TABS: 1.0000 | ORAL_TABLET | Freq: Four times a day (QID) | ORAL | 0 refills | Status: DC | PRN

## 2022-07-15 MED ORDER — ONDANSETRON HCL 4 MG/2ML IJ SOLN
INTRAMUSCULAR | Status: AC
  Filled 2022-07-15: qty 2

## 2022-07-15 MED ORDER — POVIDONE-IODINE 10 % EX SWAB: 2.0000 | Freq: Once | CUTANEOUS | Status: DC

## 2022-07-15 MED ORDER — ONDANSETRON 8 MG PO TBDP: 8.0000 mg | ORAL_TABLET | Freq: Three times a day (TID) | ORAL | 0 refills | Status: DC | PRN

## 2022-07-15 MED ORDER — FENTANYL CITRATE PF 50 MCG/ML IJ SOSY
25.0000 ug | PREFILLED_SYRINGE | INTRAMUSCULAR | Status: DC | PRN
  Administered 2022-07-15: 50 ug via INTRAVENOUS
  Filled 2022-07-15: qty 1

## 2022-07-15 MED ORDER — METOCLOPRAMIDE HCL 5 MG/ML IJ SOLN
10.0000 mg | Freq: Four times a day (QID) | INTRAMUSCULAR | Status: DC | PRN
  Administered 2022-07-15: 10 mg via INTRAVENOUS

## 2022-07-15 MED ORDER — BUPIVACAINE-EPINEPHRINE (PF) 0.5% -1:200000 IJ SOLN
INTRAMUSCULAR | Status: DC | PRN
  Administered 2022-07-15: 20 mL via PERINEURAL

## 2022-07-15 MED ORDER — CHLORHEXIDINE GLUCONATE 0.12 % MT SOLN: 15.0000 mL | Freq: Once | OROMUCOSAL | Status: DC

## 2022-07-15 MED ORDER — ONDANSETRON HCL 4 MG/2ML IJ SOLN
4.0000 mg | Freq: Once | INTRAMUSCULAR | Status: AC | PRN
  Administered 2022-07-15: 4 mg via INTRAVENOUS
  Filled 2022-07-15: qty 2

## 2022-07-15 MED ORDER — ROCURONIUM BROMIDE 10 MG/ML (PF) SYRINGE
PREFILLED_SYRINGE | INTRAVENOUS | Status: AC
  Filled 2022-07-15: qty 10

## 2022-07-15 MED ORDER — LACTATED RINGERS IV SOLN: INTRAVENOUS | Status: DC | PRN

## 2022-07-15 MED ORDER — KETOROLAC TROMETHAMINE 30 MG/ML IJ SOLN
30.0000 mg | Freq: Once | INTRAMUSCULAR | Status: AC
  Administered 2022-07-15: 30 mg via INTRAVENOUS
  Filled 2022-07-15: qty 1

## 2022-07-15 MED ORDER — 0.9 % SODIUM CHLORIDE (POUR BTL) OPTIME
TOPICAL | Status: DC | PRN
  Administered 2022-07-15: 1000 mL

## 2022-07-15 MED ORDER — KETOROLAC TROMETHAMINE 10 MG PO TABS: 10.0000 mg | ORAL_TABLET | Freq: Three times a day (TID) | ORAL | 0 refills | Status: DC | PRN

## 2022-07-15 MED ORDER — ONDANSETRON HCL 4 MG/2ML IJ SOLN
INTRAMUSCULAR | Status: AC
  Administered 2022-07-15: 4 mg via INTRAVENOUS
  Filled 2022-07-15: qty 2

## 2022-07-15 MED ORDER — SUGAMMADEX SODIUM 200 MG/2ML IV SOLN
INTRAVENOUS | Status: DC | PRN
  Administered 2022-07-15: 200 mg via INTRAVENOUS

## 2022-07-15 MED ORDER — DEXAMETHASONE SODIUM PHOSPHATE 10 MG/ML IJ SOLN
INTRAMUSCULAR | Status: AC
  Filled 2022-07-15: qty 1

## 2022-07-15 MED ORDER — PROPOFOL 10 MG/ML IV BOLUS
INTRAVENOUS | Status: DC | PRN
  Administered 2022-07-15: 200 mg via INTRAVENOUS

## 2022-07-15 MED ORDER — LIDOCAINE HCL (PF) 2 % IJ SOLN
INTRAMUSCULAR | Status: AC
  Filled 2022-07-15: qty 5

## 2022-07-15 MED ORDER — ROCURONIUM BROMIDE 10 MG/ML (PF) SYRINGE
PREFILLED_SYRINGE | INTRAVENOUS | Status: DC | PRN
  Administered 2022-07-15: 20 mg via INTRAVENOUS
  Administered 2022-07-15: 60 mg via INTRAVENOUS
  Administered 2022-07-15: 10 mg via INTRAVENOUS

## 2022-07-15 MED ORDER — ONDANSETRON HCL 4 MG/2ML IJ SOLN: 4.0000 mg | Freq: Once | INTRAMUSCULAR | Status: AC

## 2022-07-15 SURGICAL SUPPLY — 36 items
APPLIER CLIP 13 LRG OPEN (CLIP) ×1
APR CLP LRG 13 20 CLIP (CLIP) ×1
BAG HAMPER (MISCELLANEOUS) ×2 IMPLANT
CLIP APPLIE 13 LRG OPEN (CLIP) IMPLANT
CLOTH BEACON ORANGE TIMEOUT ST (SAFETY) ×2 IMPLANT
COVER LIGHT HANDLE STERIS (MISCELLANEOUS) ×4 IMPLANT
DRAPE HALF SHEET 40X57 (DRAPES) ×2 IMPLANT
DRAPE STERI URO 9X17 APER PCH (DRAPES) ×2 IMPLANT
ELECT REM PT RETURN 9FT ADLT (ELECTROSURGICAL) ×1
ELECTRODE REM PT RTRN 9FT ADLT (ELECTROSURGICAL) ×2 IMPLANT
GAUZE 4X4 16PLY ~~LOC~~+RFID DBL (SPONGE) ×2 IMPLANT
GLOVE BIOGEL PI IND STRL 7.0 (GLOVE) ×4 IMPLANT
GLOVE BIOGEL PI IND STRL 8 (GLOVE) ×2 IMPLANT
GLOVE ECLIPSE 8.0 STRL XLNG CF (GLOVE) ×4 IMPLANT
GLOVE SRG 8 PF TXTR STRL LF DI (GLOVE) ×2 IMPLANT
GLOVE SURG UNDER POLY LF SZ8 (GLOVE) ×1
GOWN STRL REUS W/TWL LRG LVL3 (GOWN DISPOSABLE) ×4 IMPLANT
GOWN STRL REUS W/TWL XL LVL3 (GOWN DISPOSABLE) ×2 IMPLANT
IV NS IRRIG 3000ML ARTHROMATIC (IV SOLUTION) ×2 IMPLANT
KIT BLADEGUARD II DBL (SET/KITS/TRAYS/PACK) ×2 IMPLANT
KIT TURNOVER CYSTO (KITS) ×2 IMPLANT
MANIFOLD NEPTUNE II (INSTRUMENTS) ×2 IMPLANT
NDL HYPO 21X1.5 SAFETY (NEEDLE) ×2 IMPLANT
NEEDLE HYPO 21X1.5 SAFETY (NEEDLE) ×1 IMPLANT
NS IRRIG 1000ML POUR BTL (IV SOLUTION) ×2 IMPLANT
PACK PERI GYN (CUSTOM PROCEDURE TRAY) ×2 IMPLANT
PAD ARMBOARD 7.5X6 YLW CONV (MISCELLANEOUS) ×2 IMPLANT
SET BASIN LINEN APH (SET/KITS/TRAYS/PACK) ×2 IMPLANT
SPONGE T-LAP 4X18 ~~LOC~~+RFID (SPONGE) IMPLANT
SUT VIC AB 0 CT1 27 (SUTURE) ×4
SUT VIC AB 0 CT1 27XCR 8 STRN (SUTURE) ×4 IMPLANT
SYR CONTROL 10ML LL (SYRINGE) ×2 IMPLANT
TRAY FOLEY W/BAG SLVR 16FR (SET/KITS/TRAYS/PACK) ×1
TRAY FOLEY W/BAG SLVR 16FR ST (SET/KITS/TRAYS/PACK) IMPLANT
VERSALIGHT (MISCELLANEOUS) ×2 IMPLANT
WATER STERILE IRR 1000ML POUR (IV SOLUTION) ×2 IMPLANT

## 2022-07-15 NOTE — H&P (Signed)
Preoperative History and Physical  Heather [name] is a 41 y.o. G3P2003 with Patient's last menstrual period was 06/19/2022. admitted for a TVH removal of tubes if possible.  Patient has been experiencing worsening dysmenorrhea and dyspareunia now since essentially the beginning of the year.  Minimally responsive to COC-->lighter bleeding but worse cramps Dyspareunia has not improved with COC of course  PMH:    Past Medical History:  Diagnosis Date   Abnormal Pap smear    Adenotonsillar hypertrophy    denies snoring during sleep or apnea   Anxiety    Complication of anesthesia    PT doesnt want any heavy narcotics   GERD (gastroesophageal reflux disease)    HPV (human papilloma virus) infection    IUD (intrauterine device) in place    Palpitations    Panic attack    RUQ pain 01/02/2016   Strep throat    to start antibiotic 08/20/2014    PSH:     Past Surgical History:  Procedure Laterality Date   CERVICAL CONIZATION W/BX N/A 08/27/2014   Procedure: CONIZATION CERVIX ;  Surgeon: Lazaro Arms, MD;  Location: [facility];  Service: Gynecology;  Laterality: N/A;  Laser conization and ablation of cervix   CHOLECYSTECTOMY N/A 07/22/2016   Procedure: LAPAROSCOPIC CHOLECYSTECTOMY;  Surgeon: Franky Macho, MD;  Location: [facility];  Service: General;  Laterality: N/A;   CO2 LASER APPLICATION N/A 08/27/2014   Procedure: CO2 LASER APPLICATION;  Surgeon: Lazaro Arms, MD;  Location: [facility];  Service: Gynecology;  Laterality: N/A;   LIVER BIOPSY N/A 07/22/2016   Procedure: LIVER BIOPSY;  Surgeon: Franky Macho, MD;  Location: [facility];  Service: General;  Laterality: N/A;   TONSILLECTOMY AND ADENOIDECTOMY Bilateral 08/27/2014   Procedure: BILATERAL TONSILLECTOMY AND ADENOIDECTOMY;  Surgeon: Darletta Moll, MD;  Location: [facility];  Service: ENT;  Laterality: Bilateral;   TYMPANOSTOMY TUBE PLACEMENT Bilateral     POb/GynH:      OB History      Gravida  3   Para  3   Term  2   Preterm      AB      Living  3      SAB      IAB      Ectopic      Multiple      Live Births  3           SH:   Social History   Tobacco Use   Smoking status: Never   Smokeless tobacco: Never  Vaping Use   Vaping Use: Never used  Substance Use Topics   Alcohol use: No   Drug use: No    FH:    Family History  Problem Relation Age of Onset   Hypertension Mother    Heart disease Maternal Grandmother        pacemaker   Gallbladder disease Maternal Grandmother    Hypertension Father    Heart disease Father        pacemaker   Cancer Paternal Grandfather        colon cancer   Stroke Paternal Grandfather    Macular degeneration Paternal Grandmother    Leukemia Son    Asthma Son    Colon cancer Neg Hx    Liver disease Neg Hx      Allergies:  Allergies  Allergen Reactions   Epinephrine Other (See Comments)    DUE TO PALPITATIONS   Lidocaine Other (  See Comments)    TACHYCARDIA   Contrast Media [Iodinated Contrast Media] Other (See Comments)    Tongue swell /mouth itching /throat tight   Doxycycline Other (See Comments)    Headaches and anxiety   Penicillins Hives    AS A CHILD - STATES CAN TAKE AMOXICILLIN Has patient had a PCN reaction causing immediate rash, facial/tongue/throat swelling, SOB or lightheadedness with hypotension: Yes Has patient had a PCN reaction causing severe rash involving mucus membranes or skin necrosis: Yes Has patient had a PCN reaction that required hospitalization Unknown Has patient had a PCN reaction occurring within the last 10 years: No If all of the above answers are "NO", then may proceed with Cephalosporin use.     Medications:       Current Facility-Administered Medications:    metroNIDAZOLE (FLAGYL) IVPB 500 mg, 500 mg, Intravenous, On Call to OR **AND** ciprofloxacin (CIPRO) IVPB 400 mg, 400 mg, Intravenous, On Call to OR, Lazaro Arms, MD   povidone-iodine 10 % swab 2  Application, 2 Application, Topical, Once, Lazaro Arms, MD  Review of Systems:   Review of Systems  Constitutional: Negative for fever, chills, weight loss, malaise/fatigue and diaphoresis.  HENT: Negative for hearing loss, ear pain, nosebleeds, congestion, sore throat, neck pain, tinnitus and ear discharge.   Eyes: Negative for blurred vision, double vision, photophobia, pain, discharge and redness.  Respiratory: Negative for cough, hemoptysis, sputum production, shortness of breath, wheezing and stridor.   Cardiovascular: Negative for chest pain, palpitations, orthopnea, claudication, leg swelling and PND.  Gastrointestinal: Positive for abdominal pain. Negative for heartburn, nausea, vomiting, diarrhea, constipation, blood in stool and melena.  Genitourinary: Negative for dysuria, urgency, frequency, hematuria and flank pain.  Musculoskeletal: Negative for myalgias, back pain, joint pain and falls.  Skin: Negative for itching and rash.  Neurological: Negative for dizziness, tingling, tremors, sensory change, speech change, focal weakness, seizures, loss of consciousness, weakness and headaches.  Endo/Heme/Allergies: Negative for environmental allergies and polydipsia. Does not bruise/bleed easily.  Psychiatric/Behavioral: Negative for depression, suicidal ideas, hallucinations, memory loss and substance abuse. The patient is not nervous/anxious and does not have insomnia.      PHYSICAL EXAM:  Blood pressure 108/63, pulse 82, temperature 98 F (36.7 C), resp. rate 16, last menstrual period 06/19/2022, SpO2 99 %.    Vitals reviewed. Constitutional: She is oriented to person, place, and time. She appears well-developed and well-nourished.  HENT:  Head: Normocephalic and atraumatic.  Right Ear: External ear normal.  Left Ear: External ear normal.  Nose: Nose normal.  Mouth/Throat: Oropharynx is clear and moist.  Eyes: Conjunctivae and EOM are normal. Pupils are equal, round, and  reactive to light. Right eye exhibits no discharge. Left eye exhibits no discharge. No scleral icterus.  Neck: Normal range of motion. Neck supple. No tracheal deviation present. No thyromegaly present.  Cardiovascular: Normal rate, regular rhythm, normal heart sounds and intact distal pulses.  Exam reveals no gallop and no friction rub.   No murmur heard. Respiratory: Effort normal and breath sounds normal. No respiratory distress. She has no wheezes. She has no rales. She exhibits no tenderness.  GI: Soft. Bowel sounds are normal. She exhibits no distension and no mass. There is tenderness. There is no rebound and no guarding.  Genitourinary:       Vulva is normal without lesions Vagina is pink moist without discharge Cervix normal in appearance and pap is normal Uterus is normal size tender with uterosacral stretching which recreates the dyspareunia,  Grade 1 descent as etiology Adnexa is negative with normal sized ovaries by sonogram  Musculoskeletal: Normal range of motion. She exhibits no edema and no tenderness.  Neurological: She is alert and oriented to person, place, and time. She has normal reflexes. She displays normal reflexes. No cranial nerve deficit. She exhibits normal muscle tone. Coordination normal.  Skin: Skin is warm and dry. No rash noted. No erythema. No pallor.  Psychiatric: She has a normal mood and affect. Her behavior is normal. Judgment and thought content normal.    Labs: Results for orders placed or performed during the hospital encounter of 07/10/22 (from the past 336 hour(s))  CBC   Collection Time: 07/10/22  1:45 PM  Result Value Ref Range   WBC 5.7 4.0 - 10.5 K/uL   RBC 4.56 3.87 - 5.11 MIL/uL   Hemoglobin 13.7 12.0 - 15.0 g/dL   HCT 40.1 36.0 - 46.0 %   MCV 87.9 80.0 - 100.0 fL   MCH 30.0 26.0 - 34.0 pg   MCHC 34.2 30.0 - 36.0 g/dL   RDW 11.8 11.5 - 15.5 %   Platelets 172 150 - 400 K/uL   nRBC 0.0 0.0 - 0.2 %  Comprehensive metabolic panel    Collection Time: 07/10/22  1:45 PM  Result Value Ref Range   Sodium 140 135 - 145 mmol/L   Potassium 3.8 3.5 - 5.1 mmol/L   Chloride 106 98 - 111 mmol/L   CO2 27 22 - 32 mmol/L   Glucose, Bld 86 70 - 99 mg/dL   BUN 12 6 - 20 mg/dL   Creatinine, Ser 0.68 0.44 - 1.00 mg/dL   Calcium 9.7 8.9 - 10.3 mg/dL   Total Protein 7.4 6.5 - 8.1 g/dL   Albumin 4.4 3.5 - 5.0 g/dL   AST 33 15 - 41 U/L   ALT 78 (H) 0 - 44 U/L   Alkaline Phosphatase 79 38 - 126 U/L   Total Bilirubin 1.0 0.3 - 1.2 mg/dL   GFR, Estimated >60 >60 mL/min   Anion gap 7 5 - 15  Rapid HIV screen (HIV 1/2 Ab+Ag)   Collection Time: 07/10/22  1:45 PM  Result Value Ref Range   HIV-1 P24 Antigen - HIV24 NON REACTIVE NON REACTIVE   HIV 1/2 Antibodies NON REACTIVE NON REACTIVE   Interpretation (HIV Ag Ab)      A non reactive test result means that HIV 1 or HIV 2 antibodies and HIV 1 p24 antigen were not detected in the specimen.  Urinalysis, Routine w reflex microscopic Urine, Clean Catch   Collection Time: 07/10/22  1:45 PM  Result Value Ref Range   Color, Urine STRAW (A) YELLOW   APPearance CLEAR CLEAR   Specific Gravity, Urine 1.003 (L) 1.005 - 1.030   pH 6.0 5.0 - 8.0   Glucose, UA NEGATIVE NEGATIVE mg/dL   Hgb urine dipstick NEGATIVE NEGATIVE   Bilirubin Urine NEGATIVE NEGATIVE   Ketones, ur NEGATIVE NEGATIVE mg/dL   Protein, ur NEGATIVE NEGATIVE mg/dL   Nitrite NEGATIVE NEGATIVE   Leukocytes,Ua NEGATIVE NEGATIVE  Type and screen   Collection Time: 07/10/22  1:45 PM  Result Value Ref Range   ABO/RH(D) A POS    Antibody Screen NEG    Sample Expiration 07/24/2022,2359    Extend sample reason      NO TRANSFUSIONS OR PREGNANCY IN THE PAST 3 MONTHS Performed at [facility], [address], [place], [place] [address]   Pregnancy, urine   Collection Time:  07/10/22  2:44 PM  Result Value Ref Range   Preg Test, Ur NEGATIVE NEGATIVE    EKG: Orders placed or performed in visit on 11/25/17   EKG    Imaging  Studies: CT not Epic showed normal size gyn oragns but with some prominent veins consistent with pelvic congestion syndrome, which I informed patient is not clinically significant    Assessment: Dyspareunia, 100%, bump, likely due to some descent Dysmenorrhea, worsening despite COC + NSAIDs  Plan: TVH remove tubes if possible  Lazaro Arms 07/15/2022 10:57 AM

## 2022-07-15 NOTE — Anesthesia Preprocedure Evaluation (Signed)
Anesthesia Evaluation  Patient identified by MRN, date of birth, ID band Patient awake    Reviewed: Allergy & Precautions, H&P , NPO status , Patient's Chart, lab work & pertinent test results, reviewed documented beta blocker date and time   History of Anesthesia Complications (+) PONV and history of anesthetic complications  Airway Mallampati: II  TM Distance: >3 FB Neck ROM: full    Dental no notable dental hx.    Pulmonary neg pulmonary ROS,    Pulmonary exam normal breath sounds clear to auscultation       Cardiovascular Exercise Tolerance: Good negative cardio ROS   Rhythm:regular Rate:Normal     Neuro/Psych PSYCHIATRIC DISORDERS Anxiety  Neuromuscular disease    GI/Hepatic Neg liver ROS, GERD  Medicated,  Endo/Other  Hypothyroidism   Renal/GU negative Renal ROS  negative genitourinary   Musculoskeletal   Abdominal   Peds  Hematology negative hematology ROS (+)   Anesthesia Other Findings   Reproductive/Obstetrics negative OB ROS                             Anesthesia Physical Anesthesia Plan  ASA: 2  Anesthesia Plan: General and General ETT   Post-op Pain Management:    Induction:   PONV Risk Score and Plan: Ondansetron  Airway Management Planned:   Additional Equipment:   Intra-op Plan:   Post-operative Plan:   Informed Consent: I have reviewed the patients History and Physical, chart, labs and discussed the procedure including the risks, benefits and alternatives for the proposed anesthesia with the patient or authorized representative who has indicated his/her understanding and acceptance.     Dental Advisory Given  Plan Discussed with: CRNA  Anesthesia Plan Comments:         Anesthesia Quick Evaluation

## 2022-07-15 NOTE — Op Note (Signed)
Preoperative diagnosis:  1.  Dyspareunia 100%                                         2.  Dysmenorrhea                                         3.  Grade 1 descent                                           Postoperative diagnosis:  Same as above   Procedure:  Vaginal hysterectomy with removal of left Fallopian tube  Surgeon:  Florian Buff MD  Anesthesia:  General Endotracheal  Findings:  normal uterus tubes and ovaries  Description of operation:  The patient was taken from the preoperative area to the operating room in stable condition. She underwent GET anesthesia ands  she was placed in the dorsal lithotomy position. Patient was prepped and draped in the usual sterile fashion and a Foley catheter was placed.  A weighted speculum was placed and the cervix was grasped with thyroid tenaculums both anteriorly and posteriorly.  0.5% Marcaine with 1/200,000 epinephrine was injected in a circumferential fashion about the cervix and the electrocautery unit was used to incise the vagina and push at all cervix.  The posterior cul-de-sac was then entered sharply without difficulty.  The uterosacral ligaments were clamped cut and transfixion suture ligated and held.  The cardinal ligaments were then clamped cut transfixion suture ligated and cut. The anterior peritoneum was identified the anterior cul-de-sac was entered sharply without difficulty. The anterior and posterior leaves of the broad ligament were plicated and the uterine vessels were clamped cut and suture ligated. Serial pedicles were taken of the fundus with each pedicle being clamped cut and suture ligated. The utero-ovarian ligaments were cross clamped the uterus was removed and both pedicles were transfixion suture ligated. The left Fallopian tube was removed.  There was good hemostasis of all the pedicles.  The anterior posterior vagina and peritoneum were closed in interrupted fashion with good resultant hemostasis. Prior to  closure the lower  pelvis and vagina were irrigated vigorously.  The sponge needle and instrument counts were correct x 3.  Total blood loss for the procedure was 100 cc.  The patient received 500 mg of flagyl and ciprofloxacin and 30 mg of Toradol IV preoperatively prophylactically.  She was taken to the recovery room in good stable condition awake alert doing well.  Florian Buff 07/15/2022, 1:31 PM

## 2022-07-15 NOTE — Anesthesia Procedure Notes (Signed)
Procedure Name: Intubation Date/Time: 07/15/2022 11:25 AM  Performed by: Myna Bright, CRNAPre-anesthesia Checklist: Patient identified, Emergency Drugs available, Suction available and Patient being monitored Patient Re-evaluated:Patient Re-evaluated prior to induction Oxygen Delivery Method: Circle system utilized Preoxygenation: Pre-oxygenation with 100% oxygen Induction Type: IV induction Ventilation: Mask ventilation without difficulty Laryngoscope Size: Mac and 3 Grade View: Grade I Tube type: Oral Tube size: 7.0 mm Number of attempts: 1 Airway Equipment and Method: Stylet Placement Confirmation: positive ETCO2 and breath sounds checked- equal and bilateral Secured at: 21 cm Tube secured with: Tape Dental Injury: Teeth and Oropharynx as per pre-operative assessment

## 2022-07-15 NOTE — Transfer of Care (Signed)
Immediate Anesthesia Transfer of Care Note  Patient: Heather [name]  Procedure(s) Performed: HYSTERECTOMY VAGINAL,with removal of tubes (Vagina )  Patient Location: PACU  Anesthesia Type:General  Level of Consciousness: awake, alert , oriented and patient cooperative  Airway & Oxygen Therapy: Patient Spontanous Breathing and Patient connected to nasal cannula oxygen  Post-op Assessment: Report given to RN, Post -op Vital signs reviewed and stable and Patient moving all extremities  Post vital signs: Reviewed and stable  Last Vitals:  Vitals Value Taken Time  BP 114/57 07/15/22 1336  Temp 37.1 C 07/15/22 1336  Pulse 97 07/15/22 1337  Resp 13 07/15/22 1337  SpO2 98 % 07/15/22 1337  Vitals shown include unvalidated device data.  Last Pain:  Vitals:   07/15/22 1010  PainSc: 0-No pain         Complications: No notable events documented.

## 2022-07-16 LAB — SURGICAL PATHOLOGY

## 2022-07-17 ENCOUNTER — Encounter: Payer: BC Managed Care – PPO | Admitting: Obstetrics & Gynecology

## 2022-07-17 NOTE — Anesthesia Postprocedure Evaluation (Signed)
Anesthesia Post Note  Patient: Heather [name]  Procedure(s) Performed: HYSTERECTOMY VAGINAL,with removal of tubes (Vagina )  Patient location during evaluation: Phase II Anesthesia Type: General Level of consciousness: awake Pain management: pain level controlled Vital Signs Assessment: post-procedure vital signs reviewed and stable Respiratory status: spontaneous breathing and respiratory function stable Cardiovascular status: blood pressure returned to baseline and stable Postop Assessment: no headache and no apparent nausea or vomiting Anesthetic complications: no Comments: Late entry   No notable events documented.   Last Vitals:  Vitals:   07/15/22 1530 07/15/22 1600  BP: 114/67 113/87  Pulse: 67 81  Resp: 14 15  Temp:  36.4 C  SpO2: 98% 98%    Last Pain:  Vitals:   07/16/22 1528  TempSrc:   PainSc: 0-No pain                 Louann Sjogren

## 2022-07-21 ENCOUNTER — Encounter (HOSPITAL_COMMUNITY): Payer: Self-pay | Admitting: Obstetrics & Gynecology

## 2022-07-21 ENCOUNTER — Ambulatory Visit (INDEPENDENT_AMBULATORY_CARE_PROVIDER_SITE_OTHER): Payer: BC Managed Care – PPO | Admitting: Obstetrics & Gynecology

## 2022-07-21 VITALS — BP 109/60 | HR 65 | Ht 65.0 in

## 2022-07-21 DIAGNOSIS — Z9889 Other specified postprocedural states: Secondary | ICD-10-CM

## 2022-07-21 NOTE — Progress Notes (Signed)
  HPI: Patient returns for routine postoperative follow-up having undergone TVH BS on 07/15/22.  The patient's immediate postoperative recovery has been unremarkable. Since hospital discharge the patient reports no problems.   Current Outpatient Medications: cetirizine (ZYRTEC) 10 MG tablet, Take 10 mg by mouth daily., Disp: , Rfl:  citalopram (CELEXA) 20 MG tablet, TAKE 1/2 (ONE-HALF) TABLET BY MOUTH ONCE DAILY, Disp: 15 tablet, Rfl: 11 famotidine (PEPCID) 20 MG tablet, Take 20 mg by mouth daily., Disp: , Rfl:  thyroid (ARMOUR) 15 MG tablet, Take 15 mg by mouth 3 (three) times daily., Disp: , Rfl:  cholecalciferol (VITAMIN D) 1000 units tablet, Take 1,000 Units by mouth daily., Disp: , Rfl:  ketorolac (TORADOL) 10 MG tablet, Take 1 tablet (10 mg total) by mouth every 8 (eight) hours as needed. (Patient not taking: Reported on 07/21/2022), Disp: 15 tablet, Rfl: 0 ondansetron (ZOFRAN-ODT) 8 MG disintegrating tablet, Take 1 tablet (8 mg total) by mouth every 8 (eight) hours as needed for nausea or vomiting. (Patient not taking: Reported on 07/21/2022), Disp: 8 tablet, Rfl: 0 oxyCODONE-acetaminophen (PERCOCET) 5-325 MG tablet, Take 1 tablet by mouth every 6 (six) hours as needed for severe pain. (Patient not taking: Reported on 07/21/2022), Disp: 20 tablet, Rfl: 0 sodium chloride (AYR) 0.65 % nasal spray, Place 1 spray into the nose daily as needed (allergies)., Disp: , Rfl:   No current facility-administered medications for this visit.    Blood pressure 109/60, pulse 65, height 5\' 5"  (1.651 m), last menstrual period 06/19/2022.  Physical Exam: Normal post op vaginal exam healing well  Diagnostic Tests:   Pathology: benign  Impression + Management plan:   ICD-10-CM   1. S/P TVH LS 07/15/22  Z98.890    normal post op course         Medications Prescribed this encounter: No orders of the defined types were placed in this encounter.     Follow up: Return in about 5 weeks  (around 08/24/2022) for [place], with Dr Elonda Husky.    Florian Buff, MD Attending Physician for the [facility] 07/21/2022 11:39 AM

## 2022-07-23 ENCOUNTER — Encounter: Payer: BC Managed Care – PPO | Admitting: Obstetrics & Gynecology

## 2022-07-27 ENCOUNTER — Encounter: Payer: Self-pay | Admitting: Obstetrics & Gynecology

## 2022-07-27 MED ORDER — SULFAMETHOXAZOLE-TRIMETHOPRIM 800-160 MG PO TABS: 1.0000 | ORAL_TABLET | Freq: Two times a day (BID) | ORAL | 0 refills | Status: DC

## 2022-08-01 ENCOUNTER — Encounter (INDEPENDENT_AMBULATORY_CARE_PROVIDER_SITE_OTHER): Payer: Self-pay | Admitting: Gastroenterology

## 2022-08-03 ENCOUNTER — Encounter: Payer: Self-pay | Admitting: Obstetrics & Gynecology

## 2022-08-25 ENCOUNTER — Encounter: Payer: BC Managed Care – PPO | Admitting: Obstetrics & Gynecology

## 2022-08-25 ENCOUNTER — Encounter: Payer: Self-pay | Admitting: Obstetrics & Gynecology

## 2022-08-25 ENCOUNTER — Ambulatory Visit (INDEPENDENT_AMBULATORY_CARE_PROVIDER_SITE_OTHER): Payer: BC Managed Care – PPO | Admitting: Obstetrics & Gynecology

## 2022-08-25 VITALS — BP 99/63 | HR 60 | Ht 65.0 in | Wt 171.0 lb

## 2022-08-25 DIAGNOSIS — Z9889 Other specified postprocedural states: Secondary | ICD-10-CM

## 2022-08-25 NOTE — Progress Notes (Signed)
  HPI: Patient returns for routine postoperative follow-up having undergone TVH LS 07/15/22.  The patient's immediate postoperative recovery has been unremarkable. Since hospital discharge the patient reports occasional spotting.   Current Outpatient Medications: cetirizine (ZYRTEC) 10 MG tablet, Take 10 mg by mouth daily., Disp: , Rfl:  cholecalciferol (VITAMIN D) 1000 units tablet, Take 1,000 Units by mouth daily., Disp: , Rfl:  citalopram (CELEXA) 20 MG tablet, TAKE 1/2 (ONE-HALF) TABLET BY MOUTH ONCE DAILY, Disp: 15 tablet, Rfl: 11 famotidine (PEPCID) 20 MG tablet, Take 20 mg by mouth daily., Disp: , Rfl:  thyroid (ARMOUR) 15 MG tablet, Take 15 mg by mouth 3 (three) times daily., Disp: , Rfl:  ketorolac (TORADOL) 10 MG tablet, Take 1 tablet (10 mg total) by mouth every 8 (eight) hours as needed. (Patient not taking: Reported on 07/21/2022), Disp: 15 tablet, Rfl: 0 ondansetron (ZOFRAN-ODT) 8 MG disintegrating tablet, Take 1 tablet (8 mg total) by mouth every 8 (eight) hours as needed for nausea or vomiting. (Patient not taking: Reported on 07/21/2022), Disp: 8 tablet, Rfl: 0 oxyCODONE-acetaminophen (PERCOCET) 5-325 MG tablet, Take 1 tablet by mouth every 6 (six) hours as needed for severe pain. (Patient not taking: Reported on 07/21/2022), Disp: 20 tablet, Rfl: 0 sodium chloride (AYR) 0.65 % nasal spray, Place 1 spray into the nose daily as needed (allergies). (Patient not taking: Reported on 08/25/2022), Disp: , Rfl:  sulfamethoxazole-trimethoprim (BACTRIM DS) 800-160 MG tablet, Take 1 tablet by mouth 2 (two) times daily. (Patient not taking: Reported on 08/25/2022), Disp: 14 tablet, Rfl: 0  No current facility-administered medications for this visit.    Blood pressure 99/63, pulse 60, height 5\' 5"  (1.651 m), weight 171 lb (77.6 kg), last menstrual period 06/19/2022.  Physical Exam: Cuff is still healing, sutures are in place Bimanual is normal  Diagnostic  Tests:   Pathology: benign  Impression + Management plan: 06/21/2022) S/P TVH LS 07/15/22  (primary encounter diagnosis) Comment: cuff is still healing, I am going to re evaluate in 5 weeks prior to restarting sexual activity     Medications Prescribed this encounter: No orders of the defined types were placed in this encounter.     Follow up: No follow-ups on file.    07/17/22, MD Attending Physician for the [facility] 08/25/2022 3:59 PM

## 2022-09-25 ENCOUNTER — Ambulatory Visit: Payer: BC Managed Care – PPO | Admitting: Obstetrics & Gynecology

## 2022-09-25 ENCOUNTER — Encounter: Payer: Self-pay | Admitting: Obstetrics & Gynecology

## 2022-09-25 ENCOUNTER — Ambulatory Visit (INDEPENDENT_AMBULATORY_CARE_PROVIDER_SITE_OTHER): Payer: BC Managed Care – PPO | Admitting: Obstetrics & Gynecology

## 2022-09-25 VITALS — BP 106/67 | HR 62 | Ht 65.0 in | Wt 173.5 lb

## 2022-09-25 DIAGNOSIS — Z9889 Other specified postprocedural states: Secondary | ICD-10-CM

## 2022-09-25 NOTE — Progress Notes (Signed)
  HPI: Patient returns for routine postoperative follow-up having undergone TVH LS on 07/15/22.  The patient's immediate postoperative recovery has been unremarkable. Since hospital discharge the patient reports no complaints.   Current Outpatient Medications: cetirizine (ZYRTEC) 10 MG tablet, Take 10 mg by mouth daily., Disp: , Rfl:  cholecalciferol (VITAMIN D) 1000 units tablet, Take 1,000 Units by mouth daily., Disp: , Rfl:  citalopram (CELEXA) 20 MG tablet, TAKE 1/2 (ONE-HALF) TABLET BY MOUTH ONCE DAILY, Disp: 15 tablet, Rfl: 11 famotidine (PEPCID) 20 MG tablet, Take 20 mg by mouth daily., Disp: , Rfl:  sodium chloride (AYR) 0.65 % nasal spray, Place 1 spray into the nose daily as needed (allergies)., Disp: , Rfl:  thyroid (ARMOUR) 15 MG tablet, Take 15 mg by mouth 3 (three) times daily., Disp: , Rfl:   No current facility-administered medications for this visit.    Blood pressure 106/67, pulse 62, height 5\' 5"  (1.651 m), weight 173 lb 8 oz (78.7 kg), last menstrual period 06/19/2022.  Physical Exam: Cuff is well healed ok for intercourse  Diagnostic Tests:   Pathology: benign  Impression + Management plan:   ICD-10-CM   1. S/P TVH LS 07/15/22  Z98.890          Medications Prescribed this encounter: No orders of the defined types were placed in this encounter.     Follow up: prn   Florian Buff, MD Attending Physician for the [facility] 09/25/2022 11:42 AM

## 2022-10-06 ENCOUNTER — Encounter: Payer: Self-pay | Admitting: Obstetrics & Gynecology

## 2022-12-03 ENCOUNTER — Encounter: Payer: Self-pay | Admitting: Obstetrics & Gynecology

## 2024-07-05 ENCOUNTER — Encounter (INDEPENDENT_AMBULATORY_CARE_PROVIDER_SITE_OTHER): Payer: Self-pay | Admitting: Gastroenterology
